# Patient Record
Sex: Male | Born: 1965 | Race: White | Hispanic: No | Marital: Married | Smoking: Former smoker
Health system: Southern US, Community
[De-identification: ages and names within clinical notes are randomized; demographics above are authoritative.]

## PROBLEM LIST (undated history)

## (undated) DIAGNOSIS — H9190 Unspecified hearing loss, unspecified ear: Secondary | ICD-10-CM

## (undated) DIAGNOSIS — I82409 Acute embolism and thrombosis of unspecified deep veins of unspecified lower extremity: Secondary | ICD-10-CM

## (undated) DIAGNOSIS — I219 Acute myocardial infarction, unspecified: Secondary | ICD-10-CM

## (undated) HISTORY — PX: BACK SURGERY: SHX140

---

## 2005-09-22 DIAGNOSIS — I82409 Acute embolism and thrombosis of unspecified deep veins of unspecified lower extremity: Secondary | ICD-10-CM

## 2005-09-22 HISTORY — DX: Acute embolism and thrombosis of unspecified deep veins of unspecified lower extremity: I82.409

## 2009-02-15 ENCOUNTER — Emergency Department (HOSPITAL_BASED_OUTPATIENT_CLINIC_OR_DEPARTMENT_OTHER): Admission: EM | Admit: 2009-02-15 | Discharge: 2009-02-15 | Payer: Self-pay | Admitting: Emergency Medicine

## 2009-02-22 ENCOUNTER — Emergency Department (HOSPITAL_BASED_OUTPATIENT_CLINIC_OR_DEPARTMENT_OTHER): Admission: EM | Admit: 2009-02-22 | Discharge: 2009-02-22 | Payer: Self-pay | Admitting: Emergency Medicine

## 2011-10-11 ENCOUNTER — Encounter (HOSPITAL_BASED_OUTPATIENT_CLINIC_OR_DEPARTMENT_OTHER): Payer: Self-pay | Admitting: *Deleted

## 2011-10-11 ENCOUNTER — Emergency Department (INDEPENDENT_AMBULATORY_CARE_PROVIDER_SITE_OTHER): Payer: BC Managed Care – PPO

## 2011-10-11 ENCOUNTER — Emergency Department (HOSPITAL_BASED_OUTPATIENT_CLINIC_OR_DEPARTMENT_OTHER)
Admission: EM | Admit: 2011-10-11 | Discharge: 2011-10-11 | Disposition: A | Discharge status: Home / Self Care | Payer: BC Managed Care – PPO | Attending: Emergency Medicine | Admitting: Emergency Medicine

## 2011-10-11 DIAGNOSIS — X58XXXA Exposure to other specified factors, initial encounter: Secondary | ICD-10-CM

## 2011-10-11 DIAGNOSIS — S6992XA Unspecified injury of left wrist, hand and finger(s), initial encounter: Secondary | ICD-10-CM

## 2011-10-11 DIAGNOSIS — W278XXA Contact with other nonpowered hand tool, initial encounter: Secondary | ICD-10-CM | POA: Insufficient documentation

## 2011-10-11 DIAGNOSIS — S6990XA Unspecified injury of unspecified wrist, hand and finger(s), initial encounter: Secondary | ICD-10-CM

## 2011-10-11 DIAGNOSIS — F17200 Nicotine dependence, unspecified, uncomplicated: Secondary | ICD-10-CM | POA: Insufficient documentation

## 2011-10-11 MED ORDER — HYDROCODONE-ACETAMINOPHEN 5-325 MG PO TABS: 2.0000 | ORAL_TABLET | Freq: Once | ORAL | Status: DC

## 2011-10-11 MED ORDER — SULFAMETHOXAZOLE-TRIMETHOPRIM 800-160 MG PO TABS: 1.0000 | ORAL_TABLET | Freq: Two times a day (BID) | ORAL | Status: AC

## 2011-10-11 MED ORDER — SULFAMETHOXAZOLE-TRIMETHOPRIM 800-160 MG PO TABS: 1.0000 | ORAL_TABLET | Freq: Two times a day (BID) | ORAL | Status: DC

## 2011-10-11 MED ORDER — HYDROCODONE-ACETAMINOPHEN 5-325 MG PO TABS: 2.0000 | ORAL_TABLET | ORAL | Status: AC | PRN

## 2011-10-11 NOTE — ED Provider Notes (Signed)
History     CSN: [id number]  Arrival date & time 10/11/11  1613   None     Chief Complaint  Patient presents with  . Hand Injury    (Consider location/radiation/quality/duration/timing/severity/associated sxs/prior treatment) Patient is a 46 y.o. male presenting with hand injury. The history is provided by the patient. No language interpreter was used.  Hand Injury  The incident occurred less than 1 hour ago. The incident occurred in the street. The injury mechanism was an incision. The pain is present in the left hand. The quality of the pain is described as aching. The pain is at a severity of 6/10. The pain is moderate. The pain has been constant since the incident. He reports no foreign bodies present. He has tried nothing for the symptoms. The treatment provided moderate relief.  Pt reports he jammed a screw driver into his hand.  Pt reports he can move everything.   History reviewed. No pertinent past medical history.  Past Surgical History  Procedure Date  . Back surgery     History reviewed. No pertinent family history.  History  Substance Use Topics  . Smoking status: Current Everyday Smoker  . Smokeless tobacco: Not on file  . Alcohol Use: No      Review of Systems  Skin: Positive for wound.  All other systems reviewed and are negative.    Allergies  Review of patient's allergies indicates no known allergies.  Home Medications  No current outpatient prescriptions on file.  BP 149/87  Pulse 76  Temp(Src) 97.8 F (36.6 C) (Oral)  Resp 20  Ht 5\' 8"  (1.727 m)  Wt 152 lb (68.947 kg)  BMI 23.11 kg/m2  SpO2 100%  Physical Exam  Nursing note and vitals reviewed. Constitutional: He appears well-developed and well-nourished.  HENT:  Head: Normocephalic.  Musculoskeletal: Normal range of motion. He exhibits tenderness.       punture wound thenar prominence,  Ns and nv intact  Neurological: He is alert.  Skin: Skin is warm.  Psychiatric: He has a  normal mood and affect.    ED Course  Procedures (including critical care time)  Labs Reviewed - No data to display Dg Hand Complete Left  10/11/2011  *RADIOLOGY REPORT*  Clinical Data: Blunt injury with screwdriver.  LEFT HAND - COMPLETE 3+ VIEW  Comparison: None.  Findings: There is no evidence of fracture or dislocation.  There is no evidence of arthropathy or other focal bony abnormality. Soft tissue swelling between the first and second metacarpals without radiopaque foreign body.  Subcutaneous air noted in the soft tissues.  IMPRESSION: Soft tissue injury.  No fracture or foreign body.  Original Report Authenticated By: Elsie Stain, M.D.     No diagnosis found.    MDM  Pt counseled on punture wounds.  I advised follow up with Orthopaedist on call for hand,  Pt given rx for hydrocodone and bactrim       Langston Masker, [place] 10/11/11 1756

## 2011-10-11 NOTE — ED Notes (Signed)
Pt states he "jabbed his hand with a screwdriver approx 1/2 hour ago" Puncture type wound to area at base of left thumb. Moves fingers. Feels touch. Cap refill < 3 sec
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
## 2011-10-12 NOTE — ED Provider Notes (Signed)
Medical screening examination/treatment/procedure(s) were performed by non-physician practitioner and as supervising physician I was immediately available for consultation/collaboration.  Hurman Horn, MD 10/12/11 [phone]

## 2011-11-01 ENCOUNTER — Encounter (HOSPITAL_COMMUNITY): Payer: Self-pay | Admitting: Emergency Medicine

## 2011-11-01 ENCOUNTER — Emergency Department (HOSPITAL_COMMUNITY)
Admission: EM | Admit: 2011-11-01 | Discharge: 2011-11-02 | Disposition: A | Discharge status: Home / Self Care | Payer: BC Managed Care – PPO | Attending: Emergency Medicine | Admitting: Emergency Medicine

## 2011-11-01 ENCOUNTER — Other Ambulatory Visit: Payer: Self-pay

## 2011-11-01 ENCOUNTER — Emergency Department (HOSPITAL_COMMUNITY): Payer: BC Managed Care – PPO

## 2011-11-01 DIAGNOSIS — R0789 Other chest pain: Secondary | ICD-10-CM | POA: Insufficient documentation

## 2011-11-01 DIAGNOSIS — I219 Acute myocardial infarction, unspecified: Secondary | ICD-10-CM

## 2011-11-01 DIAGNOSIS — F17200 Nicotine dependence, unspecified, uncomplicated: Secondary | ICD-10-CM | POA: Insufficient documentation

## 2011-11-01 HISTORY — DX: Acute myocardial infarction, unspecified: I21.9

## 2011-11-01 LAB — DIFFERENTIAL
Basophils Absolute: 0.1 10*3/uL (ref 0.0–0.1)
Basophils Relative: 1 % (ref 0–1)
Eosinophils Relative: 1 % (ref 0–5)
Lymphocytes Relative: 27 % (ref 12–46)
Monocytes Absolute: 0.9 10*3/uL (ref 0.1–1.0)
Monocytes Relative: 6 % (ref 3–12)

## 2011-11-01 LAB — COMPREHENSIVE METABOLIC PANEL
AST: 18 U/L (ref 0–37)
BUN: 13 mg/dL (ref 6–23)
CO2: 26 mEq/L (ref 19–32)
Calcium: 9.5 mg/dL (ref 8.4–10.5)
Creatinine, Ser: 0.84 mg/dL (ref 0.50–1.35)
GFR calc Af Amer: >90 mL/min (ref >90)
GFR calc non Af Amer: >90 mL/min (ref >90)

## 2011-11-01 LAB — CBC
HCT: 43.9 % (ref 39.0–52.0)
MCHC: 34.4 g/dL (ref 30.0–36.0)
MCV: 83.6 fL (ref 78.0–100.0)
RDW: 12.7 % (ref 11.5–15.5)

## 2011-11-01 LAB — CARDIAC PANEL(CRET KIN+CKTOT+MB+TROPI)
CK, MB: 2.4 ng/mL (ref 0.3–4.0)
Total CK: 100 U/L (ref 7–232)
Troponin I: <0.3 ng/mL (ref <0.30)

## 2011-11-01 MED ORDER — SODIUM CHLORIDE 0.9 % IV SOLN
Freq: Once | INTRAVENOUS | Status: AC
  Administered 2011-11-01: 23:00:00 via INTRAVENOUS
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
## 2011-11-01 NOTE — ED Provider Notes (Deleted)
9:18 PM  Date: 11/01/2011  Rate: 77  Rhythm: normal sinus rhythm  QRS Axis: normal  Intervals: normal  ST/T Wave abnormalities: normal  Conduction Disutrbances:none  Narrative Interpretation: Essentially normal EKg.  Old EKG Reviewed: none available    Carleene Cooper III, MD 11/01/11 2128

## 2011-11-01 NOTE — ED Notes (Signed)
Patient given urinal and made aware of need for urine sample 

## 2011-11-02 NOTE — ED Notes (Signed)
Patient is AOx4 and comfortable with his discharge instructions. 

## 2011-11-03 ENCOUNTER — Other Ambulatory Visit: Payer: Self-pay

## 2011-11-03 ENCOUNTER — Encounter (HOSPITAL_BASED_OUTPATIENT_CLINIC_OR_DEPARTMENT_OTHER): Payer: Self-pay

## 2011-11-03 ENCOUNTER — Emergency Department (INDEPENDENT_AMBULATORY_CARE_PROVIDER_SITE_OTHER): Payer: BC Managed Care – PPO

## 2011-11-03 ENCOUNTER — Emergency Department (HOSPITAL_BASED_OUTPATIENT_CLINIC_OR_DEPARTMENT_OTHER)
Admission: EM | Admit: 2011-11-03 | Discharge: 2011-11-03 | Disposition: A | Discharge status: Home / Self Care | Payer: BC Managed Care – PPO | Attending: Emergency Medicine | Admitting: Emergency Medicine

## 2011-11-03 DIAGNOSIS — S62339A Displaced fracture of neck of unspecified metacarpal bone, initial encounter for closed fracture: Secondary | ICD-10-CM

## 2011-11-03 DIAGNOSIS — X838XXA Intentional self-harm by other specified means, initial encounter: Secondary | ICD-10-CM

## 2011-11-03 DIAGNOSIS — R079 Chest pain, unspecified: Secondary | ICD-10-CM

## 2011-11-03 DIAGNOSIS — S62308A Unspecified fracture of other metacarpal bone, initial encounter for closed fracture: Secondary | ICD-10-CM

## 2011-11-03 DIAGNOSIS — S62319A Displaced fracture of base of unspecified metacarpal bone, initial encounter for closed fracture: Secondary | ICD-10-CM | POA: Insufficient documentation

## 2011-11-03 DIAGNOSIS — F17200 Nicotine dependence, unspecified, uncomplicated: Secondary | ICD-10-CM | POA: Insufficient documentation

## 2011-11-03 LAB — TROPONIN I: Troponin I: <0.3 ng/mL (ref <0.30)

## 2011-11-03 LAB — CBC
HCT: 43 % (ref 39.0–52.0)
MCH: 29.1 pg (ref 26.0–34.0)
MCHC: 35.1 g/dL (ref 30.0–36.0)
MCV: 82.9 fL (ref 78.0–100.0)
Platelets: 231 10*3/uL (ref 150–400)
RDW: 12.8 % (ref 11.5–15.5)
WBC: 11.9 10*3/uL — ABNORMAL HIGH (ref 4.0–10.5)

## 2011-11-03 LAB — BASIC METABOLIC PANEL
BUN: 17 mg/dL (ref 6–23)
Calcium: 9.6 mg/dL (ref 8.4–10.5)
Creatinine, Ser: 1.1 mg/dL (ref 0.50–1.35)
GFR calc Af Amer: >90 mL/min (ref >90)
GFR calc non Af Amer: 79 mL/min — ABNORMAL LOW (ref >90)

## 2011-11-03 MED ORDER — HYDROCODONE-ACETAMINOPHEN 5-500 MG PO TABS: 1.0000 | ORAL_TABLET | Freq: Four times a day (QID) | ORAL | Status: AC | PRN
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
## 2011-11-03 NOTE — ED Provider Notes (Signed)
Medical screening examination/treatment/procedure(s) were performed by non-physician practitioner and as supervising physician I was immediately available for consultation/collaboration.   Loreta Blouch M Dastan Krider, MD 11/03/11 2319 

## 2011-11-03 NOTE — ED Notes (Signed)
C/o CP x 2 days-was seen at [facility] ED for same-states CP continues-also c/o pain to left hand yesterday after punching a cabinet

## 2012-05-04 ENCOUNTER — Encounter (HOSPITAL_BASED_OUTPATIENT_CLINIC_OR_DEPARTMENT_OTHER): Payer: Self-pay

## 2012-05-04 ENCOUNTER — Emergency Department (HOSPITAL_BASED_OUTPATIENT_CLINIC_OR_DEPARTMENT_OTHER)
Admission: EM | Admit: 2012-05-04 | Discharge: 2012-05-04 | Disposition: A | Discharge status: Home / Self Care | Payer: BC Managed Care – PPO | Attending: Emergency Medicine | Admitting: Emergency Medicine

## 2012-05-04 ENCOUNTER — Emergency Department (HOSPITAL_BASED_OUTPATIENT_CLINIC_OR_DEPARTMENT_OTHER): Payer: BC Managed Care – PPO

## 2012-05-04 DIAGNOSIS — N433 Hydrocele, unspecified: Secondary | ICD-10-CM | POA: Insufficient documentation

## 2012-05-04 DIAGNOSIS — R1030 Lower abdominal pain, unspecified: Secondary | ICD-10-CM

## 2012-05-04 DIAGNOSIS — R109 Unspecified abdominal pain: Secondary | ICD-10-CM | POA: Insufficient documentation

## 2012-05-04 DIAGNOSIS — F17200 Nicotine dependence, unspecified, uncomplicated: Secondary | ICD-10-CM | POA: Insufficient documentation

## 2012-05-04 LAB — URINALYSIS, ROUTINE W REFLEX MICROSCOPIC
Bilirubin Urine: NEGATIVE
Hgb urine dipstick: NEGATIVE
Ketones, ur: NEGATIVE mg/dL
Nitrite: NEGATIVE
Urobilinogen, UA: 0.2 mg/dL (ref 0.0–1.0)

## 2012-05-04 LAB — URINE MICROSCOPIC-ADD ON

## 2012-05-04 MED ORDER — IBUPROFEN 800 MG PO TABS: 800.0000 mg | ORAL_TABLET | Freq: Three times a day (TID) | ORAL | Status: AC | PRN

## 2012-05-04 NOTE — ED Provider Notes (Signed)
History     CSN: [id number]  Arrival date & time 05/04/12  2112   First MD Initiated Contact with Patient 05/04/12 2153      Chief Complaint  Patient presents with  . Groin Pain    (Consider location/radiation/quality/duration/timing/severity/associated sxs/prior treatment) HPI Pt reports several days of R sided groin pain, associated with mild urinary burning, but no fever. Pain was initially in R testicle but has moved up over the last several hours. No swelling, lumps or penile discharge. No trauma.   History reviewed. No pertinent past medical history.  Past Surgical History  Procedure Date  . Back surgery     No family history on file.  History  Substance Use Topics  . Smoking status: Current Everyday Smoker  . Smokeless tobacco: Not on file  . Alcohol Use: Yes      Review of Systems All other systems reviewed and are negative except as noted in HPI.   Allergies  Review of patient's allergies indicates no known allergies.  Home Medications   Current Outpatient Rx  Name Route Sig Dispense Refill  . CLONAZEPAM 0.5 MG PO TABS Oral Take 0.5 mg by mouth 2 (two) times daily as needed.    Marland Kitchen PRAVASTATIN SODIUM 10 MG PO TABS Oral Take 10 mg by mouth daily.      BP 157/91  Pulse 79  Temp 97.6 F (36.4 C) (Oral)  Resp 18  Ht 5\' 8"  (1.727 m)  Wt 157 lb (71.215 kg)  BMI 23.87 kg/m2  SpO2 99%  Physical Exam  Nursing note and vitals reviewed. Constitutional: He is oriented to person, place, and time. He appears well-developed and well-nourished.  HENT:  Head: Normocephalic and atraumatic.  Eyes: EOM are normal. Pupils are equal, round, and reactive to light.  Neck: Normal range of motion. Neck supple.  Cardiovascular: Normal rate, normal heart sounds and intact distal pulses.   Pulmonary/Chest: Effort normal and breath sounds normal.  Abdominal: Bowel sounds are normal. He exhibits no distension. There is no tenderness. Hernia confirmed negative in the right  inguinal area and confirmed negative in the left inguinal area.  Genitourinary: Testes normal and penis normal.    Right testis shows no mass, no swelling and no tenderness. Left testis shows no mass, no swelling and no tenderness.  Musculoskeletal: Normal range of motion. He exhibits no edema and no tenderness.  Lymphadenopathy:       Right: No inguinal adenopathy present.       Left: No inguinal adenopathy present.  Neurological: He is alert and oriented to person, place, and time. He has normal strength. No cranial nerve deficit or sensory deficit.  Skin: Skin is warm and dry. No rash noted.  Psychiatric: He has a normal mood and affect.    ED Course  Procedures (including critical care time)  Labs Reviewed  URINALYSIS, ROUTINE W REFLEX MICROSCOPIC - Abnormal; Notable for the following:    Protein, ur 30 (*)     All other components within normal limits  URINE MICROSCOPIC-ADD ON   US Scrotum  05/04/2012  *RADIOLOGY REPORT*  Clinical Data: Right groin pain.  Testicular pain.  Dysuria.  ULTRASOUND OF SCROTUM  DOPPLER ULTRASOUND OF SCROTAL VESSELS  Technique:  Complete ultrasound examination of the testicles, epididymis, and other scrotal structures was performed.  Color and duplex Doppler ultrasound was utilized to evaluate blood flow to the testicles and scrotal contents.  Comparison: None.  Findings: The right testicle measures 47 mm x 23 mm x  24 mm.  There is normal testicular echotexture and normal color flow when compared to the left testicle.  Low left testicle measures 46 mm x 22 mm x 29 mm.  Normal echotexture.  Normal color flow.  Both testicles demonstrate normal arterial and venous waveforms.  There is a small right hydrocele.  Epididymal cyst is present in the right epididymis measuring 5 mm x 5 mm x 6 mm.  The left epididymis demonstrates a tiny epididymal cyst measuring between 1 mm and 2 mm.  Left epididymal echotexture is within normal limits.  Small left hydrocele.  No  varicoceles.  When evaluating the epididymal color flow, this appears normal and symmetric when compared to the adjacent testicle bilaterally without swelling or evidence of epididymitis.  IMPRESSION: No acute abnormality. Negative for torsion.  Small bilateral hydroceles.  Small bilateral right greater than left epididymal cysts.  Original Report Authenticated By: Andreas Newport, M.D.     No diagnosis found.    MDM  [place] above neg for acute surgical problem. ?Epididymal cyst as the cause of his pain. Advised Urology followup for any further pain.         Chaya Dehaan B. Bernette Mayers, MD 05/04/12 [phone]

## 2012-05-04 NOTE — ED Notes (Signed)
Patient transported to Ultrasound 

## 2012-05-04 NOTE — ED Notes (Signed)
Right groin pain, dysuria x 2 days

## 2013-05-31 DIAGNOSIS — H9190 Unspecified hearing loss, unspecified ear: Secondary | ICD-10-CM

## 2013-05-31 HISTORY — DX: Unspecified hearing loss, unspecified ear: H91.90

## 2013-06-09 ENCOUNTER — Emergency Department (HOSPITAL_BASED_OUTPATIENT_CLINIC_OR_DEPARTMENT_OTHER)
Admission: EM | Admit: 2013-06-09 | Discharge: 2013-06-09 | Discharge status: Against Medical Advice | Payer: BC Managed Care – PPO | Attending: Emergency Medicine | Admitting: Emergency Medicine

## 2013-06-09 ENCOUNTER — Encounter (HOSPITAL_BASED_OUTPATIENT_CLINIC_OR_DEPARTMENT_OTHER): Payer: Self-pay | Admitting: Emergency Medicine

## 2013-06-09 DIAGNOSIS — I252 Old myocardial infarction: Secondary | ICD-10-CM | POA: Insufficient documentation

## 2013-06-09 DIAGNOSIS — F17200 Nicotine dependence, unspecified, uncomplicated: Secondary | ICD-10-CM | POA: Insufficient documentation

## 2013-06-09 DIAGNOSIS — R109 Unspecified abdominal pain: Secondary | ICD-10-CM | POA: Insufficient documentation

## 2013-06-09 HISTORY — DX: Acute myocardial infarction, unspecified: I21.9

## 2013-06-09 HISTORY — DX: Unspecified hearing loss, unspecified ear: H91.90

## 2013-06-09 LAB — URINALYSIS, ROUTINE W REFLEX MICROSCOPIC
Bilirubin Urine: NEGATIVE
Glucose, UA: NEGATIVE mg/dL
Hgb urine dipstick: NEGATIVE
Specific Gravity, Urine: 1.022 (ref 1.005–1.030)
pH: 6 (ref 5.0–8.0)

## 2013-06-09 NOTE — ED Notes (Addendum)
LUQ pain intermittently x3-4 weeks of varying intensity. Decrease in appetite.  Denies N/V/D. Pain is sharp and lasts 20 - 90 min when it hits. Sometimes it "drops me to my knees".

## 2013-06-10 ENCOUNTER — Emergency Department (HOSPITAL_BASED_OUTPATIENT_CLINIC_OR_DEPARTMENT_OTHER): Payer: BC Managed Care – PPO

## 2013-06-10 ENCOUNTER — Encounter (HOSPITAL_BASED_OUTPATIENT_CLINIC_OR_DEPARTMENT_OTHER): Payer: Self-pay | Admitting: *Deleted

## 2013-06-10 ENCOUNTER — Emergency Department (HOSPITAL_BASED_OUTPATIENT_CLINIC_OR_DEPARTMENT_OTHER)
Admission: EM | Admit: 2013-06-10 | Discharge: 2013-06-10 | Disposition: A | Discharge status: Home / Self Care | Payer: BC Managed Care – PPO | Attending: Emergency Medicine | Admitting: Emergency Medicine

## 2013-06-10 DIAGNOSIS — I252 Old myocardial infarction: Secondary | ICD-10-CM | POA: Insufficient documentation

## 2013-06-10 DIAGNOSIS — F17200 Nicotine dependence, unspecified, uncomplicated: Secondary | ICD-10-CM | POA: Insufficient documentation

## 2013-06-10 DIAGNOSIS — R109 Unspecified abdominal pain: Secondary | ICD-10-CM

## 2013-06-10 DIAGNOSIS — Z79899 Other long term (current) drug therapy: Secondary | ICD-10-CM | POA: Insufficient documentation

## 2013-06-10 LAB — LIPASE, BLOOD: Lipase: 26 U/L (ref 11–59)

## 2013-06-10 LAB — URINALYSIS, ROUTINE W REFLEX MICROSCOPIC
Bilirubin Urine: NEGATIVE
Ketones, ur: 40 mg/dL — AB
Nitrite: NEGATIVE
Protein, ur: NEGATIVE mg/dL
Specific Gravity, Urine: 1.027 (ref 1.005–1.030)
Urobilinogen, UA: 1 mg/dL (ref 0.0–1.0)

## 2013-06-10 LAB — CBC WITH DIFFERENTIAL/PLATELET
Eosinophils Absolute: 0.1 10*3/uL (ref 0.0–0.7)
Eosinophils Relative: 1 % (ref 0–5)
Hemoglobin: 16.3 g/dL (ref 13.0–17.0)
Lymphs Abs: 3.7 10*3/uL (ref 0.7–4.0)
MCH: 29.4 pg (ref 26.0–34.0)
MCV: 84.3 fL (ref 78.0–100.0)
Monocytes Relative: 7 % (ref 3–12)
Neutrophils Relative %: 60 % (ref 43–77)
RBC: 5.55 MIL/uL (ref 4.22–5.81)

## 2013-06-10 LAB — COMPREHENSIVE METABOLIC PANEL
Alkaline Phosphatase: 64 U/L (ref 39–117)
BUN: 14 mg/dL (ref 6–23)
GFR calc Af Amer: >90 mL/min (ref >90)
Glucose, Bld: 88 mg/dL (ref 70–99)
Potassium: 4 mEq/L (ref 3.5–5.1)
Total Bilirubin: 0.8 mg/dL (ref 0.3–1.2)
Total Protein: 7.2 g/dL (ref 6.0–8.3)

## 2013-06-10 MED ORDER — IOHEXOL 300 MG/ML  SOLN
100.0000 mL | Freq: Once | INTRAMUSCULAR | Status: AC | PRN
  Administered 2013-06-10: 100 mL via INTRAVENOUS

## 2013-06-10 MED ORDER — TRAMADOL HCL 50 MG PO TABS: 50.0000 mg | ORAL_TABLET | Freq: Four times a day (QID) | ORAL | Status: DC | PRN

## 2013-06-10 MED ORDER — DICYCLOMINE HCL 20 MG PO TABS: 20.0000 mg | ORAL_TABLET | Freq: Four times a day (QID) | ORAL | Status: DC | PRN

## 2013-06-10 MED ORDER — SODIUM CHLORIDE 0.9 % IV BOLUS (SEPSIS)
1000.0000 mL | Freq: Once | INTRAVENOUS | Status: AC
  Administered 2013-06-10: 1000 mL via INTRAVENOUS

## 2013-06-10 MED ORDER — IOHEXOL 300 MG/ML  SOLN
50.0000 mL | Freq: Once | INTRAMUSCULAR | Status: AC | PRN
  Administered 2013-06-10: 50 mL via ORAL

## 2013-06-10 NOTE — ED Provider Notes (Signed)
CSN: [id number]     Arrival date & time 06/10/13  1319 History   First MD Initiated Contact with Patient 06/10/13 1453     Chief Complaint  Patient presents with  . Abdominal Pain   (Consider location/radiation/quality/duration/timing/severity/associated sxs/prior Treatment) HPI Comments: Patient presents to the ER for evaluation of left-sided abdominal pain. Patient reports that the symptoms have been present for 2 or 3 weeks. Initially he was experiencing intermittent pain, the last 2 days, however, pain has been more continuous. He reports that he continues to have pain in the left mid and lower abdomen with intermittent episodes of sharp stabbing pains. No associated fever, nausea, vomiting, diarrhea, urinary symptoms. Patient has not identified any alleviating or exacerbating factors.  Patient is a 47 y.o. male presenting with abdominal pain.  Abdominal Pain   Past Medical History  Diagnosis Date  . MI (myocardial infarction)   . Hearing deficit    Past Surgical History  Procedure Laterality Date  . Back surgery     History reviewed. No pertinent family history. History  Substance Use Topics  . Smoking status: Current Every Day Smoker -- 1.00 packs/day  . Smokeless tobacco: Not on file  . Alcohol Use: Yes    Review of Systems  Gastrointestinal: Positive for abdominal pain.  All other systems reviewed and are negative.    Allergies  Review of patient's allergies indicates no known allergies.  Home Medications   Current Outpatient Rx  Name  Route  Sig  Dispense  Refill  . clonazePAM (KLONOPIN) 0.5 MG tablet   Oral   Take 0.5 mg by mouth 2 (two) times daily as needed.         . pravastatin (PRAVACHOL) 10 MG tablet   Oral   Take 10 mg by mouth daily.          BP 128/79  Pulse 72  Temp(Src) 97.6 F (36.4 C) (Oral)  Resp 16  Ht 5\' 8"  (1.727 m)  Wt 160 lb (72.576 kg)  BMI 24.33 kg/m2  SpO2 100% Physical Exam  Constitutional: He is oriented to person,  place, and time. He appears well-developed and well-nourished. No distress.  HENT:  Head: Normocephalic and atraumatic.  Right Ear: Hearing normal.  Left Ear: Hearing normal.  Nose: Nose normal.  Mouth/Throat: Oropharynx is clear and moist and mucous membranes are normal.  Eyes: Conjunctivae and EOM are normal. Pupils are equal, round, and reactive to light.  Neck: Normal range of motion. Neck supple.  Cardiovascular: Regular rhythm, S1 normal and S2 normal.  Exam reveals no gallop and no friction rub.   No murmur heard. Pulmonary/Chest: Effort normal and breath sounds normal. No respiratory distress. He exhibits no tenderness.  Abdominal: Soft. Normal appearance and bowel sounds are normal. There is no hepatosplenomegaly. There is tenderness. There is no rebound, no guarding, no tenderness at McBurney's point and negative Murphy's sign. No hernia.    Musculoskeletal: Normal range of motion.  Neurological: He is alert and oriented to person, place, and time. He has normal strength. No cranial nerve deficit or sensory deficit. Coordination normal. GCS eye subscore is 4. GCS verbal subscore is 5. GCS motor subscore is 6.  Skin: Skin is warm, dry and intact. No rash noted. No cyanosis.  Psychiatric: He has a normal mood and affect. His speech is normal and behavior is normal. Thought content normal.    ED Course  Procedures (including critical care time) Labs Review Labs Reviewed  CBC WITH DIFFERENTIAL - Abnormal;  Notable for the following:    WBC 11.4 (*)    All other components within normal limits  COMPREHENSIVE METABOLIC PANEL - Abnormal; Notable for the following:    GFR calc non Af Amer 89 (*)    All other components within normal limits  URINALYSIS, ROUTINE W REFLEX MICROSCOPIC - Abnormal; Notable for the following:    Ketones, ur 40 (*)    All other components within normal limits  LIPASE, BLOOD   Imaging Review Ct Abdomen Pelvis W Contrast  06/10/2013   CLINICAL DATA:   Left abdominal pain, leukocytosis  EXAM: CT ABDOMEN AND PELVIS WITH CONTRAST  TECHNIQUE: Multidetector CT imaging of the abdomen and pelvis was performed using the standard protocol following bolus administration of intravenous contrast.  CONTRAST:  OMNIPAQUE IOHEXOL 300 MG/ML  SOLN  COMPARISON:  None.  FINDINGS: Lung bases are clear.  Scattered hepatic cysts and/or hemangiomas measuring up to 9 mm in the left hepatic lobe (series 2/image 10).  Spleen, pancreas, and adrenal glands are within normal limits.  Gallbladder is unremarkable. No intrahepatic or extrahepatic ductal dilatation.  Kidneys are within normal limits. No hydronephrosis.  No evidence of bowel obstruction. Normal appendix.  No evidence of abdominal aortic aneurysm.  No abdominopelvic ascites.  No suspicious abdominopelvic lymphadenopathy.  Prostate is unremarkable.  Bladder is within normal limits.  Degenerative changes at L5-S1.  IMPRESSION: No evidence of bowel obstruction. Normal appendix.  No CT findings to account for the patient's left abdominal pain.   Electronically Signed   By: Charline Bills M.D.   On: 06/10/2013 16:57    MDM  Diagnosis: Abdominal pain  Patient presents to the ER for evaluation of abdominal pain. Patient reports that the pain has been intermittent for the last several weeks. He has no other associated symptoms. Examination revealed mild tenderness on the left side of the abdomen without peritonitis. Workup is unrevealing. Blood work as well as CAT scan of abdomen and pelvis without contrast did not show any acute abnormalities. She will be treated with antispasmodic and analgesia, refer to GI.    Gilda Crease, MD 06/10/13 [phone]

## 2013-06-10 NOTE — ED Notes (Signed)
Patient transported to CT 

## 2013-06-10 NOTE — ED Notes (Signed)
Pt reports that he left AMA last night because he waited too long.  States that he has had intermittent LLQ pain x 2-3 weeks.  Denies urinary symptoms. Decreased appetite.  Denies N/V/D.

## 2016-04-11 ENCOUNTER — Emergency Department (HOSPITAL_BASED_OUTPATIENT_CLINIC_OR_DEPARTMENT_OTHER): Payer: Self-pay

## 2016-04-11 ENCOUNTER — Emergency Department (HOSPITAL_BASED_OUTPATIENT_CLINIC_OR_DEPARTMENT_OTHER)
Admission: EM | Admit: 2016-04-11 | Discharge: 2016-04-12 | Disposition: A | Discharge status: Home / Self Care | Payer: Self-pay | Attending: Emergency Medicine | Admitting: Emergency Medicine

## 2016-04-11 ENCOUNTER — Encounter (HOSPITAL_BASED_OUTPATIENT_CLINIC_OR_DEPARTMENT_OTHER): Payer: Self-pay

## 2016-04-11 DIAGNOSIS — I252 Old myocardial infarction: Secondary | ICD-10-CM | POA: Insufficient documentation

## 2016-04-11 DIAGNOSIS — F17200 Nicotine dependence, unspecified, uncomplicated: Secondary | ICD-10-CM | POA: Insufficient documentation

## 2016-04-11 DIAGNOSIS — M79661 Pain in right lower leg: Secondary | ICD-10-CM | POA: Insufficient documentation

## 2016-04-11 HISTORY — DX: Acute embolism and thrombosis of unspecified deep veins of unspecified lower extremity: I82.409

## 2016-04-11 NOTE — ED Provider Notes (Signed)
CSN: [id number]     Arrival date & time 04/11/16  2123 History   First MD Initiated Contact with Patient 04/11/16 2356     Chief Complaint  Patient presents with  . Leg Pain     (Consider location/radiation/quality/duration/timing/severity/associated sxs/prior Treatment) HPI  This is a 50 year old male with a remote history of DVT in his right lower leg. He is here with a one-day history of pain in his right medial calf. The pain is moderate and worse with palpation. Pain is similar to previous DVT. There is no associated redness or swelling. He denies injury.   Past Medical History  Diagnosis Date  . MI (myocardial infarction) (HCC)   . Hearing deficit   . DVT (deep venous thrombosis) [facility](HCC)    Past Surgical History  Procedure Laterality Date  . Back surgery     No family history on file. Social History  Substance Use Topics  . Smoking status: Current Every Day Smoker -- 1.00 packs/day  . Smokeless tobacco: None  . Alcohol Use: No    Review of Systems  All other systems reviewed and are negative.   Allergies  Review of patient's allergies indicates no known allergies.  Home Medications   Prior to Admission medications   Not on File   BP 148/100 mmHg  Pulse 58  Temp(Src) 98.6 F (37 C) (Oral)  Resp 16  Ht 5\' 8"  (1.727 m)  Wt 150 lb (68.04 kg)  BMI 22.81 kg/m2  SpO2 100%   Physical Exam  General: Well-developed, well-nourished male in no acute distress; appearance consistent with age of record HENT: normocephalic; atraumatic Eyes: pupils equal, round and reactive to light; extraocular muscles intact Neck: supple Heart: regular rate and rhythm; Lungs: clear to auscultation bilaterally Abdomen: soft; nondistended Extremities: No deformity; full range of motion; pulses normal; tenderness of right medial calf without edema, erythema or warmth Neurologic: Awake, alert and oriented; motor function intact in all extremities and symmetric; no facial droop Skin:  Warm and dry Psychiatric: Normal mood and affect    ED Course  Procedures (including critical care time)   MDM  Nursing notes and vitals signs, including pulse oximetry, reviewed.  Summary of this visit's results, reviewed by myself:  Imaging Studies: [place] Venous Img Lower Unilateral Right  04/11/2016  CLINICAL DATA:  Right lower leg pain for 2 days. History of right calf DVT 10 years ago. EXAM: Choose 2 LOWER EXTREMITY VENOUS DOPPLER ULTRASOUND TECHNIQUE: Gray-scale sonography with graded compression, as well as color Doppler and duplex ultrasound were performed to evaluate the lower extremity deep venous systems from the level of the common femoral vein and including the common femoral, femoral, profunda femoral, popliteal and calf veins including the posterior tibial, peroneal and gastrocnemius veins when visible. The superficial great saphenous vein was also interrogated. Spectral Doppler was utilized to evaluate flow at rest and with distal augmentation maneuvers in the common femoral, femoral and popliteal veins. COMPARISON:  None. FINDINGS: Contralateral Common Femoral Vein: Respiratory phasicity is normal and symmetric with the symptomatic side. No evidence of thrombus. Normal compressibility. Common Femoral Vein: No evidence of thrombus. Normal compressibility, respiratory phasicity and response to augmentation. Saphenofemoral Junction: No evidence of thrombus. Normal compressibility and flow on color Doppler imaging. Profunda Femoral Vein: No evidence of thrombus. Normal compressibility and flow on color Doppler imaging. Femoral Vein: No evidence of thrombus. Normal compressibility, respiratory phasicity and response to augmentation. Popliteal Vein: No evidence of thrombus. Normal compressibility, respiratory phasicity and response to augmentation.  Calf Veins: No evidence of thrombus. Normal compressibility and flow on color Doppler imaging. Superficial Great Saphenous Vein: No evidence of  thrombus. Normal compressibility and flow on color Doppler imaging. Venous Reflux:  None. Other Findings:  None. IMPRESSION: No evidence of deep venous thrombosis. Electronically Signed   By: Sebastian Ache M.D.   On: 04/11/2016 23:11   Patient advised of reassuring Doppler findings.     Paula Libra, MD 04/12/16 0005

## 2016-04-11 NOTE — ED Notes (Signed)
Right LE pain x 2 days-denies injury-hx of blood clot to leg-NAD-steady gait

## 2016-04-12 MED ORDER — NAPROXEN 250 MG PO TABS
500.0000 mg | ORAL_TABLET | Freq: Once | ORAL | Status: AC
  Administered 2016-04-12: 500 mg via ORAL
  Filled 2016-04-12: qty 2

## 2018-02-09 ENCOUNTER — Emergency Department (HOSPITAL_BASED_OUTPATIENT_CLINIC_OR_DEPARTMENT_OTHER): Payer: Self-pay

## 2018-02-09 ENCOUNTER — Emergency Department (HOSPITAL_BASED_OUTPATIENT_CLINIC_OR_DEPARTMENT_OTHER)
Admission: EM | Admit: 2018-02-09 | Discharge: 2018-02-09 | Disposition: A | Discharge status: Home / Self Care | Payer: Self-pay | Attending: Emergency Medicine | Admitting: Emergency Medicine

## 2018-02-09 ENCOUNTER — Other Ambulatory Visit: Payer: Self-pay

## 2018-02-09 ENCOUNTER — Encounter (HOSPITAL_BASED_OUTPATIENT_CLINIC_OR_DEPARTMENT_OTHER): Payer: Self-pay | Admitting: *Deleted

## 2018-02-09 DIAGNOSIS — I252 Old myocardial infarction: Secondary | ICD-10-CM | POA: Insufficient documentation

## 2018-02-09 DIAGNOSIS — W01198A Fall on same level from slipping, tripping and stumbling with subsequent striking against other object, initial encounter: Secondary | ICD-10-CM | POA: Insufficient documentation

## 2018-02-09 DIAGNOSIS — S2231XA Fracture of one rib, right side, initial encounter for closed fracture: Secondary | ICD-10-CM | POA: Insufficient documentation

## 2018-02-09 DIAGNOSIS — Y9389 Activity, other specified: Secondary | ICD-10-CM | POA: Insufficient documentation

## 2018-02-09 DIAGNOSIS — Y92012 Bathroom of single-family (private) house as the place of occurrence of the external cause: Secondary | ICD-10-CM | POA: Insufficient documentation

## 2018-02-09 DIAGNOSIS — Y998 Other external cause status: Secondary | ICD-10-CM | POA: Insufficient documentation

## 2018-02-09 MED ORDER — DICLOFENAC SODIUM 1 % TD GEL: 4.0000 g | Freq: Four times a day (QID) | TRANSDERMAL | 0 refills | Status: DC

## 2018-02-09 MED ORDER — LIDOCAINE 5 % EX PTCH: 1.0000 | MEDICATED_PATCH | CUTANEOUS | 0 refills | Status: DC

## 2018-02-09 MED ORDER — KETOROLAC TROMETHAMINE 60 MG/2ML IM SOLN
30.0000 mg | Freq: Once | INTRAMUSCULAR | Status: AC
Start: 2018-02-09 — End: 2018-02-09
  Administered 2018-02-09: 30 mg via INTRAMUSCULAR
  Filled 2018-02-09: qty 2

## 2018-02-09 MED ORDER — TRAMADOL HCL 50 MG PO TABS: 50.0000 mg | ORAL_TABLET | Freq: Four times a day (QID) | ORAL | 0 refills | Status: DC | PRN

## 2018-02-09 MED ORDER — TRAMADOL HCL 50 MG PO TABS
50.0000 mg | ORAL_TABLET | Freq: Once | ORAL | Status: AC
  Administered 2018-02-09: 50 mg via ORAL
  Filled 2018-02-09: qty 1

## 2018-02-09 NOTE — ED Notes (Signed)
ED Provider at bedside. 

## 2018-02-09 NOTE — ED Triage Notes (Signed)
He fell off a 2' ladder hitting his right ribs on a tub. Pain in his ribs.

## 2018-02-09 NOTE — Discharge Instructions (Addendum)
You have sustained a rib injury.  There is evidence of a single nondisplaced fracture of the right seventh rib.  Please adhere to the following instructions: Incentive spirometer: This device is used to ensure proper expansion of the lungs and to help prevent secondary issues, such as pneumonia.  Think of this as physical therapy for your lungs while you are injured.  Perform lung expansion exercises every 1-2 hours while awake.  Have an initial goal of 1000 mL and then work to increase this value.  Antiinflammatory medications: Take 600 mg of ibuprofen every 6 hours or 440 mg (over the counter dose) to 500 mg (prescription dose) of naproxen every 12 hours for the next 3 days. After this time, these medications may be used as needed for pain. Take these medications with food to avoid upset stomach. Choose only one of these medications, do not take them together. Tylenol: Should you continue to have additional pain while taking the ibuprofen or naproxen, you may add in tylenol as needed. Your daily total maximum amount of tylenol from all sources should be limited to /day for persons without liver problems, or /day for those with liver problems. Tramadol: May use the tramadol, as needed, for severe pain.  Do not drive or perform other dangerous activities while taking the tramadol. Diclofenac gel: This is a topical anti-inflammatory medication.  You may try applying it for localized pain relief.  Other pain management: Many parts of pain management involve experimentation to find what works for you as an individual patient.  You may try lidocaine patches, topical pain relievers, or hot/cold packs.  You may also need to change your sleeping position.  This may involve sleeping propped up in a chair or with extra pillows.  Duration of pain: For bruising or contusions to the ribs, pain can last 4-6 weeks.  For rib fractures, you can expect to have discomfort for 6-12 weeks.  It should be noted  that even if there are no obvious fractures on the x-rays, you may have what is called an occult fracture.  This simply means that the bone is broken, but is not broken enough to be noted on x-ray.  Follow-up: Please follow-up with a primary care provider for any further management of this issue.  Any further pain management should also be handled by a primary care provider.  Return: Return to the ED should you begin to have significantly worsening pain, onset of shortness of breath (not just hesitancy to take a deep breath due to pain), fever over 100.3 F accompanied by cough, coughing up blood, or any other major concerns.

## 2018-02-09 NOTE — ED Provider Notes (Signed)
[facility] EMERGENCY DEPARTMENT Provider Note   CSN: [id number] Arrival date & time: 02/09/18  1743     History   Chief Complaint Chief Complaint  Patient presents with  . Fall    HPI Colton White is a 52 y.o. male.  HPI   Colton White is a 52 y.o. male, with a history of DVT and MI, presenting to the ED with right-sided rib pain following a fall that occurred around 1 PM today.  States he slipped while standing on a 2 foot ladder and landed with his right ribs against the edge of a bathtub.  Pain is severe, sharp, nonradiating.  He has not taken any medications prior to arrival.   Denies shortness of breath, neck/back pain, head injury, LOC, abdominal pain, nausea/vomiting, changes in bowel or bladder function, numbness, weakness, or any other complaints.   Past Medical History:  Diagnosis Date  . DVT (deep venous thrombosis) (HCC)   . Hearing deficit   . MI (myocardial infarction) (HCC)     There are no active problems to display for this patient.   Past Surgical History:  Procedure Laterality Date  . BACK SURGERY          Home Medications    Prior to Admission medications   Medication Sig Start Date End Date Taking? Authorizing Provider  diclofenac sodium (VOLTAREN) 1 % GEL Apply 4 g topically 4 (four) times daily. 02/09/18   Demya Scruggs C, PA-C  lidocaine (LIDODERM) 5 % Place 1 patch onto the skin daily. Remove & Discard patch within 12 hours or as directed by MD 02/09/18   Laythan Hayter C, PA-C  traMADol (ULTRAM) 50 MG tablet Take 1 tablet (50 mg total) by mouth every 6 (six) hours as needed. 02/09/18   Maycen Degregory, Hillard Danker, PA-C    Family History No family history on file.  Social History Social History   Tobacco Use  . Smoking status: Current Every Day Smoker    Packs/day: 1.00  . Smokeless tobacco: Never Used  Substance Use Topics  . Alcohol use: No  . Drug use: No     Allergies   Patient has no known allergies.   Review of Systems Review of  Systems  Respiratory: Negative for shortness of breath.   Cardiovascular: Negative for chest pain.  Gastrointestinal: Negative for abdominal pain, nausea and vomiting.  Genitourinary: Negative for hematuria.  Musculoskeletal: Negative for back pain and neck pain.       Right rib pain  Neurological: Negative for weakness and numbness.  All other systems reviewed and are negative.    Physical Exam Updated Vital Signs BP (!) 153/96 (BP Location: Right Arm)   Pulse 91   Temp 98.3 F (36.8 C) (Oral)   Resp 18   Ht  (1.727 m)   Wt 72.6 kg (160 lb)   SpO2 96%   BMI 24.33 kg/m   Physical Exam  Constitutional: He appears well-developed and well-nourished. No distress.  HENT:  Head: Normocephalic and atraumatic.  Eyes: Conjunctivae are normal.  Neck: Neck supple.  Cardiovascular: Normal rate, regular rhythm, normal heart sounds and intact distal pulses.  Pulmonary/Chest: Effort normal and breath sounds normal. No respiratory distress.     He exhibits tenderness.  No increased work of breathing.  Speaks in full sentences without difficulty.  Tenderness in the area indicated.  No noted bruising, instability, crepitus, or deformity.  Abdominal: Soft. There is no tenderness. There is no guarding.  Musculoskeletal: He exhibits  no edema.  Normal motor function intact in all extremities and spine. No midline spinal tenderness.   Lymphadenopathy:    He has no cervical adenopathy.  Neurological: He is alert.  No sensory deficits. Strength 5/5 in all extremities. No gait disturbance. Cranial nerves III-XII grossly intact.   Skin: Skin is warm and dry. He is not diaphoretic.  Psychiatric: He has a normal mood and affect. His behavior is normal.  Nursing note and vitals reviewed.    ED Treatments / Results  Labs (all labs ordered are listed, but only abnormal results are displayed) Labs Reviewed - No data to display  EKG None  Radiology Dg Ribs Unilateral W/chest  Right  Result Date: 02/09/2018 CLINICAL DATA:  Patient fell off ladder landing on chest. EXAM: RIGHT RIBS AND CHEST - 3+ VIEW COMPARISON:  CXR 11/03/2011 FINDINGS: Acute nondisplaced posterolateral right seventh rib fracture. There is no evidence of pneumothorax or pleural effusion. Both lungs are clear. Heart size and mediastinal contours are within normal limits. IMPRESSION: Acute nondisplaced posterolateral right seventh rib fracture. Electronically Signed   By: Tollie Eth M.D.   On: 02/09/2018 18:43    Procedures Procedures (including critical care time)  Medications Ordered in ED Medications  traMADol (ULTRAM) tablet 50 mg (has no administration in time range)  ketorolac (TORADOL) injection 30 mg (has no administration in time range)     Initial Impression / Assessment and Plan / ED Course  I have reviewed the triage vital signs and the nursing notes.  Pertinent labs & imaging results that were available during my care of the patient were reviewed by me and considered in my medical decision making (see chart for details).     Patient presents for evaluation following a fall.  Nondisplaced single rib fracture noted on x-ray.  No evidence of pneumothorax. Pain management was discussed.  States he prefers no strong narcotics, such as Vicodin or Percocet.  Tramadol was brought up as an option and patient stated he would try this. Discussed incentive spirometry.  Patient given a tutorial and sent home with spirometer. The patient was given instructions for home care as well as return precautions. Patient voices understanding of these instructions, accepts the plan, and is comfortable with discharge.   Final Clinical Impressions(s) / ED Diagnoses   Final diagnoses:  Closed fracture of one rib of right side, initial encounter    ED Discharge Orders        Ordered    traMADol (ULTRAM) 50 MG tablet  Every 6 hours PRN     02/09/18 1929    diclofenac sodium (VOLTAREN) 1 % GEL  4  times daily     02/09/18 1929    lidocaine (LIDODERM) 5 %  Every 24 hours     02/09/18 1929       Concepcion Living 02/09/18 1936    Terrilee Files, MD 02/10/18 1009

## 2019-02-12 IMAGING — CR DG RIBS W/ CHEST 3+V*R*
5 series · 5 of 5 positions shown · non-contrast
Comparison: CXR 11/03/2011

CLINICAL DATA: Patient fell off ladder landing on chest.

EXAM:
RIGHT RIBS AND CHEST - 3+ VIEW

[w chest pa]
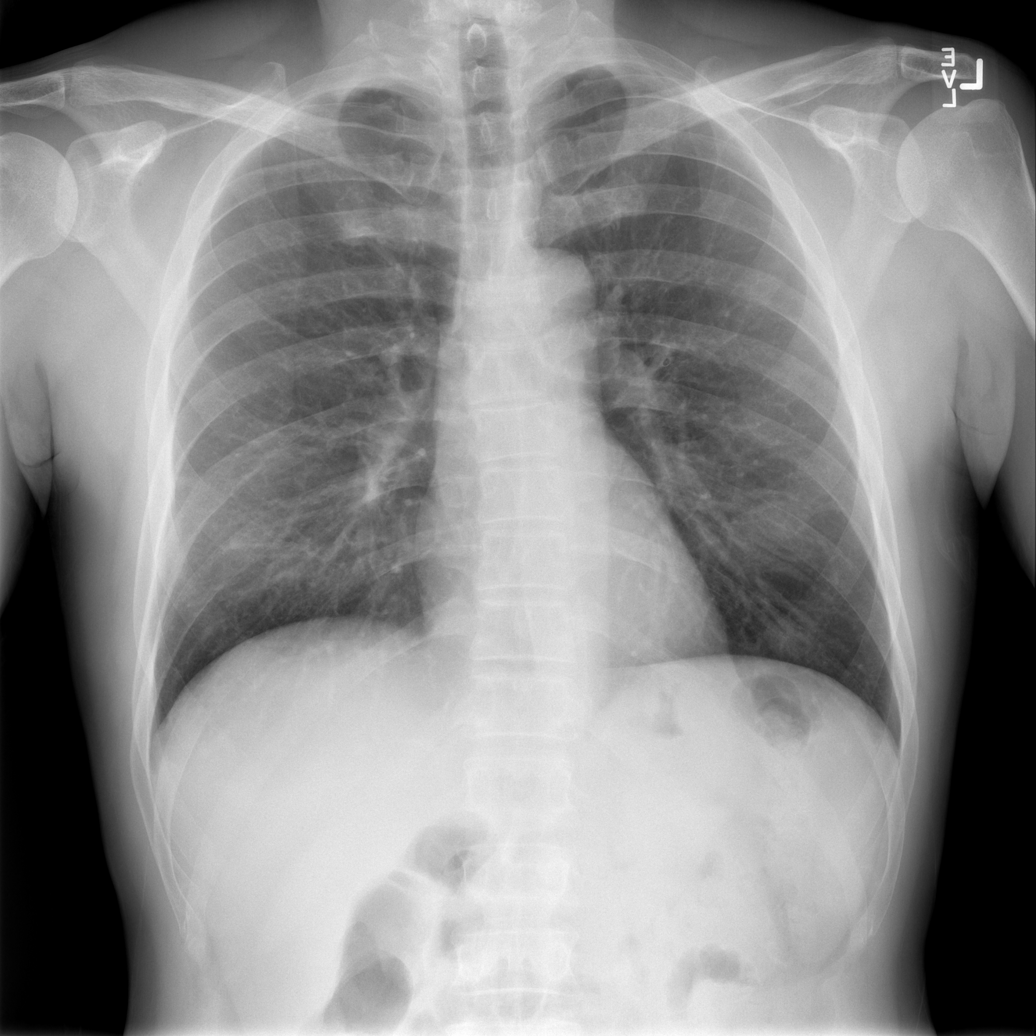

[w ribs ap/pa upper right (1 of 2)]
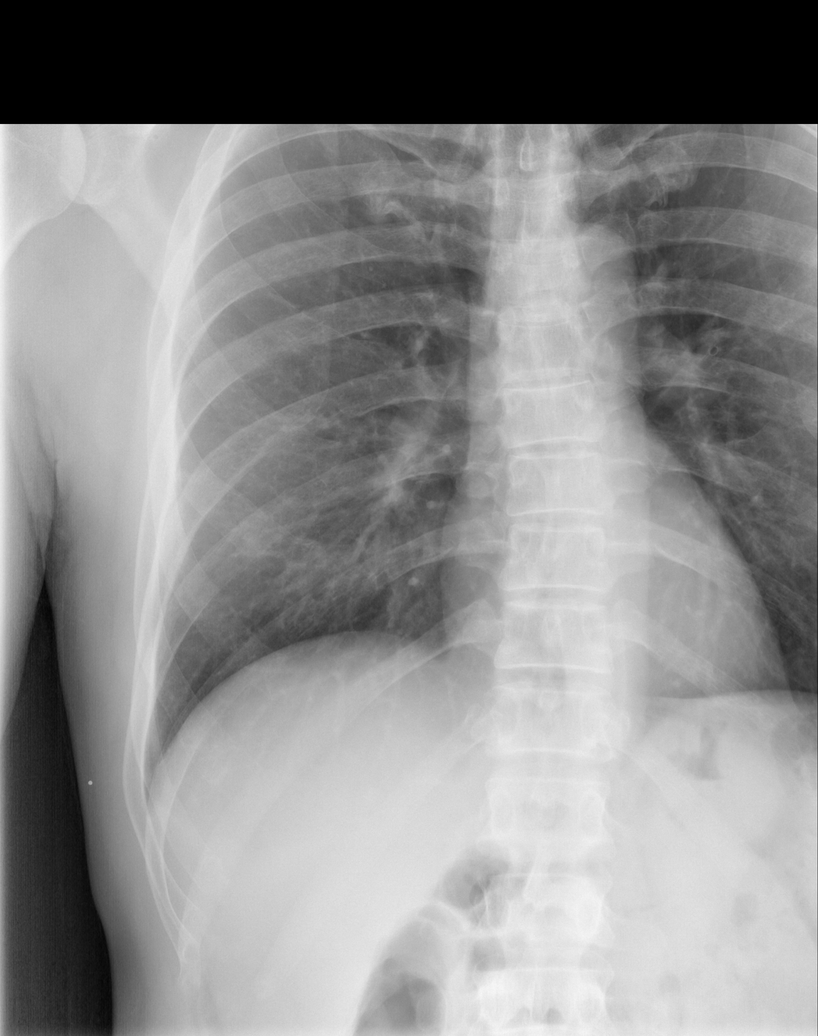

[w ribs ap/pa upper right (2 of 2)]
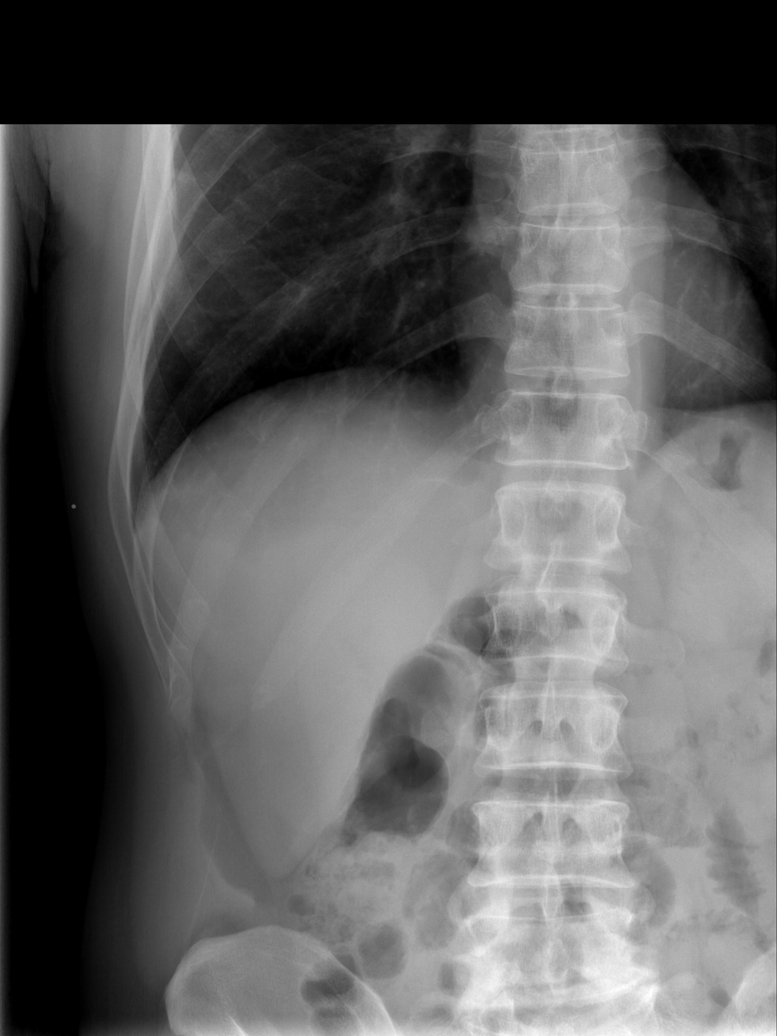

[w ribs ap/pa lower right]
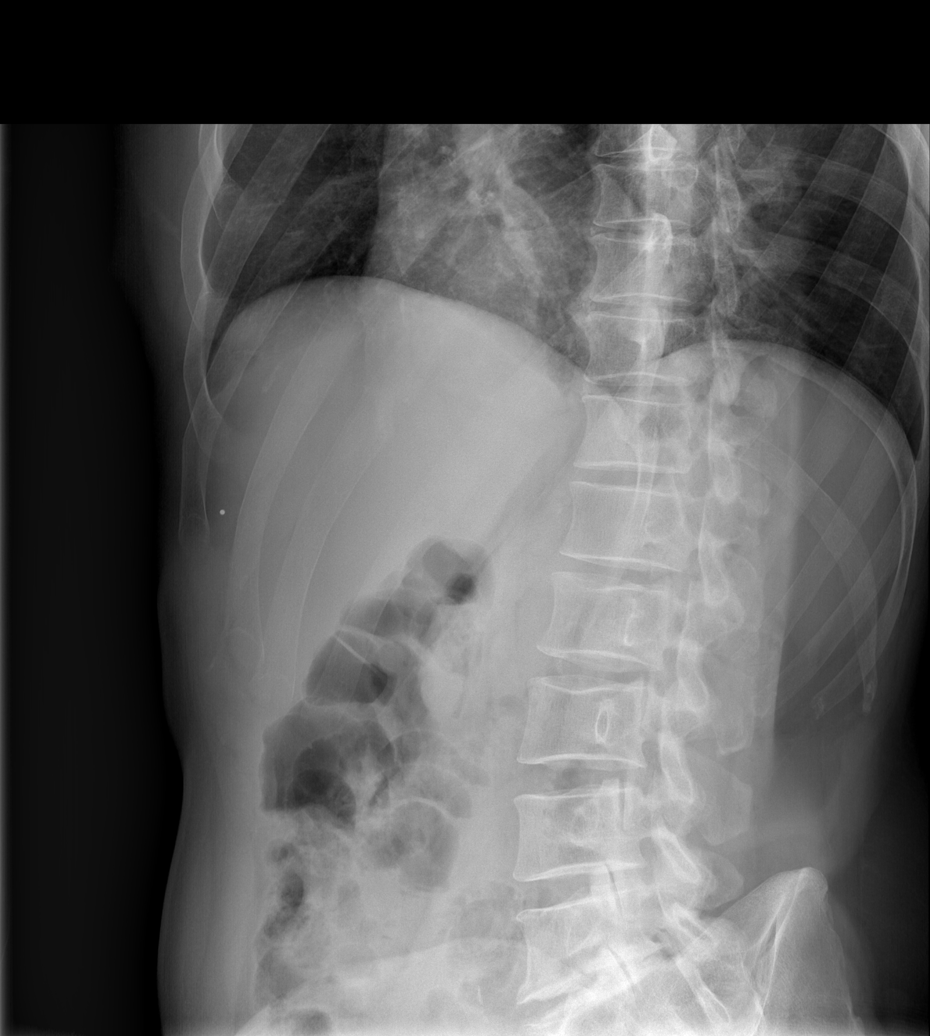

[w ribs oblique right]
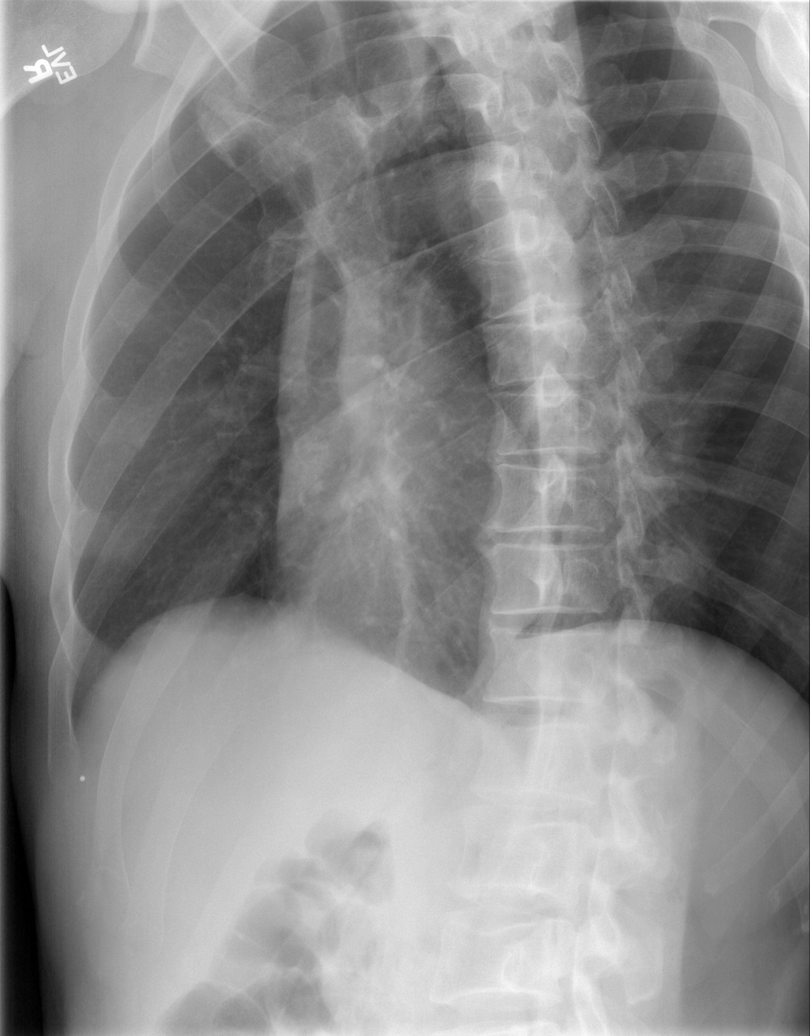

[5 of 5 positions shown; findings below may reference images not displayed]

FINDINGS: Acute nondisplaced posterolateral right seventh rib fracture. There
is no evidence of pneumothorax or pleural effusion. Both lungs are
clear. Heart size and mediastinal contours are within normal limits.
IMPRESSION: Acute nondisplaced posterolateral right seventh rib fracture.

## 2023-09-09 ENCOUNTER — Emergency Department (HOSPITAL_BASED_OUTPATIENT_CLINIC_OR_DEPARTMENT_OTHER): Payer: Medicaid Other

## 2023-09-09 ENCOUNTER — Encounter (HOSPITAL_BASED_OUTPATIENT_CLINIC_OR_DEPARTMENT_OTHER): Payer: Self-pay

## 2023-09-09 ENCOUNTER — Inpatient Hospital Stay (HOSPITAL_BASED_OUTPATIENT_CLINIC_OR_DEPARTMENT_OTHER)
Admission: EM | Admit: 2023-09-09 | Discharge: 2023-09-11 | DRG: 322 | Disposition: A | Discharge status: Home / Self Care | Payer: Medicaid Other | Attending: Cardiology | Admitting: Cardiology

## 2023-09-09 ENCOUNTER — Encounter (HOSPITAL_COMMUNITY)
Admission: EM | Disposition: A | Discharge status: Home / Self Care | Payer: Self-pay | Source: Home / Self Care | Attending: Cardiology

## 2023-09-09 ENCOUNTER — Other Ambulatory Visit: Payer: Self-pay

## 2023-09-09 ENCOUNTER — Other Ambulatory Visit (HOSPITAL_COMMUNITY): Payer: Self-pay

## 2023-09-09 DIAGNOSIS — I213 ST elevation (STEMI) myocardial infarction of unspecified site: Principal | ICD-10-CM

## 2023-09-09 DIAGNOSIS — Z955 Presence of coronary angioplasty implant and graft: Secondary | ICD-10-CM

## 2023-09-09 DIAGNOSIS — I2111 ST elevation (STEMI) myocardial infarction involving right coronary artery: Secondary | ICD-10-CM | POA: Diagnosis not present

## 2023-09-09 DIAGNOSIS — I2121 ST elevation (STEMI) myocardial infarction involving left circumflex coronary artery: Secondary | ICD-10-CM | POA: Diagnosis not present

## 2023-09-09 DIAGNOSIS — Z7902 Long term (current) use of antithrombotics/antiplatelets: Secondary | ICD-10-CM

## 2023-09-09 DIAGNOSIS — H9190 Unspecified hearing loss, unspecified ear: Secondary | ICD-10-CM | POA: Diagnosis not present

## 2023-09-09 DIAGNOSIS — Z8249 Family history of ischemic heart disease and other diseases of the circulatory system: Secondary | ICD-10-CM | POA: Diagnosis not present

## 2023-09-09 DIAGNOSIS — F17210 Nicotine dependence, cigarettes, uncomplicated: Secondary | ICD-10-CM | POA: Diagnosis present

## 2023-09-09 DIAGNOSIS — I472 Ventricular tachycardia, unspecified: Secondary | ICD-10-CM | POA: Diagnosis not present

## 2023-09-09 DIAGNOSIS — I2510 Atherosclerotic heart disease of native coronary artery without angina pectoris: Secondary | ICD-10-CM | POA: Diagnosis not present

## 2023-09-09 DIAGNOSIS — R079 Chest pain, unspecified: Secondary | ICD-10-CM | POA: Diagnosis not present

## 2023-09-09 DIAGNOSIS — Z79899 Other long term (current) drug therapy: Secondary | ICD-10-CM

## 2023-09-09 DIAGNOSIS — E785 Hyperlipidemia, unspecified: Secondary | ICD-10-CM | POA: Diagnosis not present

## 2023-09-09 DIAGNOSIS — Z86718 Personal history of other venous thrombosis and embolism: Secondary | ICD-10-CM | POA: Diagnosis not present

## 2023-09-09 DIAGNOSIS — I10 Essential (primary) hypertension: Secondary | ICD-10-CM

## 2023-09-09 DIAGNOSIS — Z7982 Long term (current) use of aspirin: Secondary | ICD-10-CM | POA: Diagnosis not present

## 2023-09-09 HISTORY — DX: Hyperlipidemia, unspecified: E78.5

## 2023-09-09 HISTORY — DX: Essential (primary) hypertension: I10

## 2023-09-09 HISTORY — PX: CORONARY/GRAFT ACUTE MI REVASCULARIZATION: CATH118305

## 2023-09-09 LAB — TROPONIN I (HIGH SENSITIVITY)
Troponin I (High Sensitivity): 84 ng/L — ABNORMAL HIGH (ref <18)
Troponin I (High Sensitivity): 3766 ng/L (ref <18)
Troponin I (High Sensitivity): 7894 ng/L (ref <18)

## 2023-09-09 LAB — CBC
HCT: 50.3 % (ref 39.0–52.0)
HCT: 48.4 % (ref 39.0–52.0)
Hemoglobin: 17.1 g/dL — ABNORMAL HIGH (ref 13.0–17.0)
Hemoglobin: 17 g/dL (ref 13.0–17.0)
MCH: 29.1 pg (ref 26.0–34.0)
MCH: 29.3 pg (ref 26.0–34.0)
MCHC: 34 g/dL (ref 30.0–36.0)
MCHC: 35.1 g/dL (ref 30.0–36.0)
MCV: 85.5 fL (ref 80.0–100.0)
MCV: 83.4 fL (ref 80.0–100.0)
Platelets: 337 10*3/uL (ref 150–400)
Platelets: 332 10*3/uL (ref 150–400)
RBC: 5.88 MIL/uL — ABNORMAL HIGH (ref 4.22–5.81)
RBC: 5.8 MIL/uL (ref 4.22–5.81)
RDW: 12.6 % (ref 11.5–15.5)
RDW: 12.6 % (ref 11.5–15.5)
WBC: 22 10*3/uL — ABNORMAL HIGH (ref 4.0–10.5)
WBC: 20.5 10*3/uL — ABNORMAL HIGH (ref 4.0–10.5)
nRBC: 0 % (ref 0.0–0.2)
nRBC: 0 % (ref 0.0–0.2)

## 2023-09-09 LAB — MRSA NEXT GEN BY PCR, NASAL: MRSA by PCR Next Gen: NOT DETECTED

## 2023-09-09 LAB — LIPID PANEL
Cholesterol: 165 mg/dL (ref 0–200)
HDL: 45 mg/dL (ref >40)
LDL Cholesterol: 96 mg/dL (ref 0–99)
Total CHOL/HDL Ratio: 3.7 {ratio}
Triglycerides: 122 mg/dL (ref <150)
VLDL: 24 mg/dL (ref 0–40)

## 2023-09-09 LAB — COMPREHENSIVE METABOLIC PANEL
ALT: 19 U/L (ref 0–44)
AST: 25 U/L (ref 15–41)
Albumin: 4.3 g/dL (ref 3.5–5.0)
Alkaline Phosphatase: 64 U/L (ref 38–126)
Anion gap: 11 (ref 5–15)
BUN: 20 mg/dL (ref 6–20)
CO2: 22 mmol/L (ref 22–32)
Calcium: 9.2 mg/dL (ref 8.9–10.3)
Chloride: 104 mmol/L (ref 98–111)
Creatinine, Ser: 1.14 mg/dL (ref 0.61–1.24)
GFR, Estimated: >60 mL/min (ref >60)
Glucose, Bld: 173 mg/dL — ABNORMAL HIGH (ref 70–99)
Potassium: 3.8 mmol/L (ref 3.5–5.1)
Sodium: 137 mmol/L (ref 135–145)
Total Bilirubin: 0.8 mg/dL (ref <1.2)
Total Protein: 7.5 g/dL (ref 6.5–8.1)

## 2023-09-09 LAB — PROTIME-INR
INR: 1 (ref 0.8–1.2)
Prothrombin Time: 13.1 s (ref 11.4–15.2)

## 2023-09-09 LAB — POCT ACTIVATED CLOTTING TIME
Activated Clotting Time: 395 s
Activated Clotting Time: 469 s

## 2023-09-09 LAB — HEMOGLOBIN A1C
Hgb A1c MFr Bld: 5.7 % — ABNORMAL HIGH (ref 4.8–5.6)
Hgb A1c MFr Bld: 5.9 % — ABNORMAL HIGH (ref 4.8–5.6)
Mean Plasma Glucose: 116.89 mg/dL
Mean Plasma Glucose: 122.63 mg/dL

## 2023-09-09 LAB — CREATININE, SERUM
Creatinine, Ser: 1.2 mg/dL (ref 0.61–1.24)
GFR, Estimated: >60 mL/min (ref >60)

## 2023-09-09 LAB — APTT: aPTT: 33 s (ref 24–36)

## 2023-09-09 LAB — HIV ANTIBODY (ROUTINE TESTING W REFLEX): HIV Screen 4th Generation wRfx: NONREACTIVE

## 2023-09-09 LAB — CG4 I-STAT (LACTIC ACID): Lactic Acid, Venous: 0.9 mmol/L (ref 0.5–1.9)

## 2023-09-09 SURGERY — CORONARY/GRAFT ACUTE MI REVASCULARIZATION
Anesthesia: LOCAL

## 2023-09-09 MED ORDER — CARVEDILOL 12.5 MG PO TABS
12.5000 mg | ORAL_TABLET | Freq: Two times a day (BID) | ORAL | Status: DC
  Administered 2023-09-09 – 2023-09-11 (×4): 12.5 mg via ORAL
  Filled 2023-09-09 (×4): qty 1

## 2023-09-09 MED ORDER — HYDRALAZINE HCL 20 MG/ML IJ SOLN
INTRAMUSCULAR | Status: DC | PRN
  Administered 2023-09-09: 10 mg via INTRAVENOUS

## 2023-09-09 MED ORDER — ORAL CARE MOUTH RINSE: 15.0000 mL | OROMUCOSAL | Status: DC | PRN

## 2023-09-09 MED ORDER — ENOXAPARIN SODIUM 40 MG/0.4ML IJ SOSY: 40.0000 mg | PREFILLED_SYRINGE | INTRAMUSCULAR | Status: DC

## 2023-09-09 MED ORDER — HEPARIN SODIUM (PORCINE) 5000 UNIT/ML IJ SOLN
60.0000 [IU]/kg | Freq: Once | INTRAMUSCULAR | Status: AC
  Administered 2023-09-09: 3750 [IU] via INTRAVENOUS
  Filled 2023-09-09: qty 1

## 2023-09-09 MED ORDER — LIDOCAINE HCL (PF) 1 % IJ SOLN
INTRAMUSCULAR | Status: AC
  Filled 2023-09-09: qty 30

## 2023-09-09 MED ORDER — TICAGRELOR 90 MG PO TABS
90.0000 mg | ORAL_TABLET | Freq: Two times a day (BID) | ORAL | Status: DC
  Administered 2023-09-09 – 2023-09-11 (×4): 90 mg via ORAL
  Filled 2023-09-09 (×4): qty 1

## 2023-09-09 MED ORDER — SODIUM CHLORIDE 0.9 % IV SOLN: 250.0000 mL | INTRAVENOUS | Status: AC | PRN

## 2023-09-09 MED ORDER — ACETAMINOPHEN 325 MG PO TABS: 650.0000 mg | ORAL_TABLET | ORAL | Status: DC | PRN

## 2023-09-09 MED ORDER — SODIUM CHLORIDE 0.9% FLUSH
3.0000 mL | Freq: Two times a day (BID) | INTRAVENOUS | Status: DC
  Administered 2023-09-09 – 2023-09-10 (×4): 3 mL via INTRAVENOUS

## 2023-09-09 MED ORDER — HEPARIN (PORCINE) 25000 UT/250ML-% IV SOLN
750.0000 [IU]/h | INTRAVENOUS | Status: DC
  Administered 2023-09-09: 750 [IU]/h via INTRAVENOUS
  Filled 2023-09-09: qty 250

## 2023-09-09 MED ORDER — LABETALOL HCL 5 MG/ML IV SOLN
10.0000 mg | INTRAVENOUS | Status: AC | PRN
Start: 2023-09-09 — End: 2023-09-09
  Administered 2023-09-09: 10 mg via INTRAVENOUS
  Filled 2023-09-09: qty 4

## 2023-09-09 MED ORDER — SODIUM CHLORIDE 0.9% FLUSH
3.0000 mL | INTRAVENOUS | Status: DC | PRN
Start: 2023-09-09 — End: 2023-09-11

## 2023-09-09 MED ORDER — LIDOCAINE HCL (PF) 1 % IJ SOLN
INTRAMUSCULAR | Status: DC | PRN
  Administered 2023-09-09: 2 mL

## 2023-09-09 MED ORDER — ASPIRIN 81 MG PO CHEW
324.0000 mg | CHEWABLE_TABLET | Freq: Once | ORAL | Status: AC
  Administered 2023-09-09: 324 mg via ORAL
  Filled 2023-09-09: qty 4

## 2023-09-09 MED ORDER — ASPIRIN 81 MG PO CHEW
81.0000 mg | CHEWABLE_TABLET | Freq: Every day | ORAL | Status: DC
  Administered 2023-09-10 – 2023-09-11 (×2): 81 mg via ORAL
  Filled 2023-09-09 (×2): qty 1

## 2023-09-09 MED ORDER — ONDANSETRON HCL 4 MG/2ML IJ SOLN: 4.0000 mg | Freq: Four times a day (QID) | INTRAMUSCULAR | Status: DC | PRN

## 2023-09-09 MED ORDER — TICAGRELOR 90 MG PO TABS
ORAL_TABLET | ORAL | Status: AC
  Filled 2023-09-09: qty 2

## 2023-09-09 MED ORDER — HEPARIN SODIUM (PORCINE) 1000 UNIT/ML IJ SOLN
INTRAMUSCULAR | Status: AC
  Filled 2023-09-09: qty 10

## 2023-09-09 MED ORDER — ENOXAPARIN SODIUM 40 MG/0.4ML IJ SOSY
40.0000 mg | PREFILLED_SYRINGE | INTRAMUSCULAR | Status: DC
Start: 2023-09-10 — End: 2023-09-11
  Administered 2023-09-10 – 2023-09-11 (×2): 40 mg via SUBCUTANEOUS
  Filled 2023-09-09 (×2): qty 0.4

## 2023-09-09 MED ORDER — NITROGLYCERIN 1 MG/10 ML FOR IR/CATH LAB
INTRA_ARTERIAL | Status: DC | PRN
  Administered 2023-09-09 (×2): 200 ug via INTRACORONARY

## 2023-09-09 MED ORDER — HYDRALAZINE HCL 20 MG/ML IJ SOLN
INTRAMUSCULAR | Status: AC
  Filled 2023-09-09: qty 1

## 2023-09-09 MED ORDER — VERAPAMIL HCL 2.5 MG/ML IV SOLN
INTRAVENOUS | Status: AC
  Filled 2023-09-09: qty 2

## 2023-09-09 MED ORDER — VERAPAMIL HCL 2.5 MG/ML IV SOLN
INTRAVENOUS | Status: DC | PRN
  Administered 2023-09-09: 10 mL via INTRA_ARTERIAL

## 2023-09-09 MED ORDER — ACETAMINOPHEN 325 MG PO TABS
650.0000 mg | ORAL_TABLET | ORAL | Status: DC | PRN
  Administered 2023-09-09: 650 mg via ORAL
  Filled 2023-09-09: qty 2

## 2023-09-09 MED ORDER — NITROGLYCERIN 0.4 MG SL SUBL: 0.4000 mg | SUBLINGUAL_TABLET | SUBLINGUAL | Status: DC | PRN

## 2023-09-09 MED ORDER — LOSARTAN POTASSIUM 50 MG PO TABS
50.0000 mg | ORAL_TABLET | Freq: Every day | ORAL | Status: DC
  Administered 2023-09-09: 50 mg via ORAL
  Filled 2023-09-09: qty 1

## 2023-09-09 MED ORDER — HEPARIN (PORCINE) IN NACL 1000-0.9 UT/500ML-% IV SOLN
INTRAVENOUS | Status: DC | PRN
  Administered 2023-09-09 (×2): 500 mL

## 2023-09-09 MED ORDER — HEPARIN SODIUM (PORCINE) 1000 UNIT/ML IJ SOLN
INTRAMUSCULAR | Status: DC | PRN
  Administered 2023-09-09: 6000 [IU] via INTRAVENOUS

## 2023-09-09 MED ORDER — IOHEXOL 350 MG/ML SOLN
INTRAVENOUS | Status: DC | PRN
  Administered 2023-09-09: 225 mL

## 2023-09-09 MED ORDER — ONDANSETRON HCL 4 MG/2ML IJ SOLN
4.0000 mg | Freq: Four times a day (QID) | INTRAMUSCULAR | Status: DC | PRN
Start: 2023-09-09 — End: 2023-09-11

## 2023-09-09 MED ORDER — SODIUM CHLORIDE 0.9 % IV SOLN
INTRAVENOUS | Status: AC
  Administered 2023-09-09: 10 mL/h via INTRAVENOUS

## 2023-09-09 MED ORDER — TICAGRELOR 90 MG PO TABS
ORAL_TABLET | ORAL | Status: DC | PRN
  Administered 2023-09-09: 180 mg via ORAL

## 2023-09-09 MED ORDER — HYDRALAZINE HCL 20 MG/ML IJ SOLN: 10.0000 mg | INTRAMUSCULAR | Status: AC | PRN

## 2023-09-09 MED ORDER — NITROGLYCERIN 1 MG/10 ML FOR IR/CATH LAB
INTRA_ARTERIAL | Status: AC
  Filled 2023-09-09: qty 10

## 2023-09-09 MED ORDER — ROSUVASTATIN CALCIUM 20 MG PO TABS
40.0000 mg | ORAL_TABLET | Freq: Every day | ORAL | Status: DC
  Administered 2023-09-09 – 2023-09-11 (×3): 40 mg via ORAL
  Filled 2023-09-09 (×3): qty 2

## 2023-09-09 MED ORDER — CHLORHEXIDINE GLUCONATE CLOTH 2 % EX PADS
6.0000 | MEDICATED_PAD | Freq: Every day | CUTANEOUS | Status: DC
  Administered 2023-09-09 – 2023-09-10 (×2): 6 via TOPICAL

## 2023-09-09 SURGICAL SUPPLY — 25 items
BALLN EMERGE MR 2.5X12 (BALLOONS) ×1 IMPLANT
BALLN ~~LOC~~ EMERGE MR 3.25X12 (BALLOONS) ×1 IMPLANT
BALLN ~~LOC~~ EMERGE MR 4.5X12 (BALLOONS) ×1 IMPLANT
BALLOON EMERGE MR 2.5X12 (BALLOONS) IMPLANT
BALLOON ~~LOC~~ EMERGE MR 3.25X12 (BALLOONS) IMPLANT
BALLOON ~~LOC~~ EMERGE MR 4.5X12 (BALLOONS) IMPLANT
CATH INFINITI 5 FR JL3.5 (CATHETERS) IMPLANT
CATH INFINITI JR4 5F (CATHETERS) IMPLANT
CATH LAUNCHER 6FR EBU3.5 (CATHETERS) IMPLANT
CATH LAUNCHER 6FR JR4 (CATHETERS) IMPLANT
DEVICE RAD COMP TR BAND LRG (VASCULAR PRODUCTS) IMPLANT
GLIDESHEATH SLEND SS 6F .021 (SHEATH) IMPLANT
GUIDEWIRE INQWIRE 1.5J.035X260 (WIRE) IMPLANT
INQWIRE 1.5J .035X260CM (WIRE) ×1 IMPLANT
KIT ENCORE 26 ADVANTAGE (KITS) IMPLANT
PACK CARDIAC CATHETERIZATION (CUSTOM PROCEDURE TRAY) ×1 IMPLANT
SET ATX-X65L (MISCELLANEOUS) IMPLANT
SHEATH PROBE COVER 6X72 (BAG) IMPLANT
STENT SYNERGY XD 3.0X16 (Permanent Stent) IMPLANT
STENT SYNERGY XD 4.0X32 (Permanent Stent) IMPLANT
SYNERGY XD 3.0X16 (Permanent Stent) ×1 IMPLANT
SYNERGY XD 4.0X32 (Permanent Stent) ×1 IMPLANT
TUBING CIL FLEX 10 FLL-RA (TUBING) IMPLANT
WIRE ASAHI PROWATER 180CM (WIRE) IMPLANT
WIRE RUNTHROUGH .014X180CM (WIRE) IMPLANT

## 2023-09-09 NOTE — Plan of Care (Signed)

## 2023-09-09 NOTE — ED Provider Notes (Signed)
EMERGENCY DEPARTMENT AT [facility] Provider Note  CSN: [id number] Arrival date & time: 09/09/23 4098  Chief Complaint(s) Chest Pain  HPI Colton White is a 57 y.o. male with past medical history as below, significant for prior MI, DVT, back surgery who presents to the ED with complaint of chest pain  He has been having intermittent chest pain over the past 2 weeks, worsened this morning, heaviness, pressure sensation midsternum, physical truck is sitting on his chest.  Feeling lightheaded, slightly diaphoretic, no nausea.  Pain is worsened since onset this morning.  Does not follow with PCP, does not take any regular medications.  He smokes cigarettes  Past Medical History Past Medical History:  Diagnosis Date   DVT (deep venous thrombosis) (HCC)    Hearing deficit    MI (myocardial infarction) (HCC)    There are no active problems to display for this patient.  Home Medication(s) Prior to Admission medications   Medication Sig Start Date End Date Taking? Authorizing Provider  diclofenac sodium (VOLTAREN) 1 % GEL Apply 4 g topically 4 (four) times daily. 02/09/18   Joy, Shawn C, PA-C  lidocaine (LIDODERM) 5 % Place 1 patch onto the skin daily. Remove & Discard patch within 12 hours or as directed by MD 02/09/18   Joy, Shawn C, PA-C  traMADol (ULTRAM) 50 MG tablet Take 1 tablet (50 mg total) by mouth every 6 (six) hours as needed. 02/09/18   Anselm Pancoast, PA-C                                                                                                                                    Past Surgical History Past Surgical History:  Procedure Laterality Date   BACK SURGERY     Family History History reviewed. No pertinent family history.  Social History Social History   Tobacco Use   Smoking status: Every Day    Current packs/day: 1.00    Types: Cigarettes   Smokeless tobacco: Never  Substance Use Topics   Alcohol use: No   Drug use: No    Allergies Patient has no known allergies.  Review of Systems Review of Systems  Constitutional:  Positive for diaphoresis.  Respiratory:  Positive for chest tightness. Negative for shortness of breath.   Cardiovascular:  Positive for chest pain. Negative for palpitations.  Gastrointestinal:  Positive for nausea. Negative for vomiting.  Genitourinary:  Negative for difficulty urinating.  Musculoskeletal:  Negative for arthralgias.  All other systems reviewed and are negative.   Physical Exam Vital Signs  I have reviewed the triage vital signs BP (!) 183/117   Pulse 76   Temp 98.2 F (36.8 C) (Oral)   Resp 13   Ht 5\' 8"  (1.727 m)   Wt 62.5 kg   SpO2 100%   BMI 20.95 kg/m  Physical Exam Vitals and nursing note reviewed.  Constitutional:  General: He is not in acute distress.    Appearance: He is well-developed. He is diaphoretic.  HENT:     Head: Normocephalic and atraumatic.     Right Ear: External ear normal.     Left Ear: External ear normal.     Mouth/Throat:     Mouth: Mucous membranes are moist.  Eyes:     General: No scleral icterus. Cardiovascular:     Rate and Rhythm: Normal rate and regular rhythm.     Pulses: Normal pulses.     Heart sounds: Normal heart sounds.     No S3 or S4 sounds.  Pulmonary:     Effort: Pulmonary effort is normal. No respiratory distress.     Breath sounds: Normal breath sounds.  Abdominal:     General: Abdomen is flat.     Palpations: Abdomen is soft.     Tenderness: There is no abdominal tenderness.  Musculoskeletal:     Cervical back: No rigidity.     Right lower leg: No edema.     Left lower leg: No edema.  Skin:    General: Skin is warm.     Capillary Refill: Capillary refill takes less than 2 seconds.  Neurological:     Mental Status: He is alert.  Psychiatric:        Mood and Affect: Mood normal.        Behavior: Behavior normal.     ED Results and Treatments Labs (all labs ordered are listed, but only  abnormal results are displayed) Labs Reviewed  CBC - Abnormal; Notable for the following components:      Result Value   WBC 22.0 (*)    RBC 5.88 (*)    Hemoglobin 17.1 (*)    All other components within normal limits  COMPREHENSIVE METABOLIC PANEL - Abnormal; Notable for the following components:   Glucose, Bld 173 (*)    All other components within normal limits  TROPONIN I (HIGH SENSITIVITY) - Abnormal; Notable for the following components:   Troponin I (High Sensitivity) 84 (*)    All other components within normal limits  PROTIME-INR  APTT  HEMOGLOBIN A1C  LIPID PANEL  I-STAT CG4 LACTIC ACID, ED                                                                                                                          Radiology No results found.  Pertinent labs & imaging results that were available during my care of the patient were reviewed by me and considered in my medical decision making (see MDM for details).  Medications Ordered in ED Medications  0.9 %  sodium chloride infusion (10 mL/hr Intravenous New Bag/Given 09/09/23 1000)  heparin ADULT infusion 100 units/mL (25000 units/269mL) (750 Units/hr Intravenous New Bag/Given 09/09/23 1008)  aspirin chewable tablet 324 mg (324 mg Oral Given 09/09/23 0956)  heparin injection 3,750 Units (3,750 Units Intravenous Given 09/09/23 0956)  Procedures .Critical Care  Performed by: Sloan Leiter, DO Authorized by: Sloan Leiter, DO   Critical care provider statement:    Critical care time (minutes):  30   Critical care time was exclusive of:  Separately billable procedures and treating other patients   Critical care was necessary to treat or prevent imminent or life-threatening deterioration of the following conditions:  Cardiac failure   Critical care was time spent personally by me on the  following activities:  Development of treatment plan with patient or surrogate, discussions with consultants, evaluation of patient's response to treatment, examination of patient, ordering and review of laboratory studies, ordering and review of radiographic studies, ordering and performing treatments and interventions, pulse oximetry, re-evaluation of patient's condition, review of old charts and obtaining history from patient or surrogate   Care discussed with: admitting provider     (including critical care time)  Medical Decision Making / ED Course    Medical Decision Making:    Thaddues Petrilli is a 57 y.o. male with past medical history as below, significant for prior MI, DVT, back surgery who presents to the ED with complaint of chest pain. The complaint involves an extensive differential diagnosis and also carries with it a high risk of complications and morbidity.  Serious etiology was considered. Ddx includes but is not limited to: Differential includes all life-threatening causes for chest pain. This includes but is not exclusive to acute coronary syndrome, aortic dissection, pulmonary embolism, cardiac tamponade, community-acquired pneumonia, pericarditis, musculoskeletal chest wall pain, etc.   Complete initial physical exam performed, notably the patient was in no acute stress, ST elevation noted on telemetry.    Reviewed and confirmed nursing documentation for past medical history, family history, social history.  Vital signs reviewed.     Clinical Course as of 09/09/23 1029  Wed Sep 09, 2023  2595 Inferior MI on EKG, he is having ongoing chest pain. Acutely symptomatic. Activate code stemi, give heparin bolus and start ASA. Awaiting callback from stemi doc [SG]  1006 Unable to reach Dr [place] or Dr Lynnette Caffey, both in the OR, spoke with [facility] who is working on finding doc  [SG]    Clinical Course User Index [SG] Sloan Leiter, DO    Brief summary: 57 yo male who does not  seek regular primary care here w/ chest pain, intermittent last 2 wks, worst today   On arrival EKG concerning for inferior MI.  Get posterior EKG which confirms inferior posterior MI.  Will activate code STEMI, contact cardiology;  give aspirin, give heparin bolus  Care like notified, emergency traffic, send to [facility] Lab at [facility].  Dr. Lynnette Caffey accepting. Pt / spouse agreeable to plan. Updated on findings.   [facility] at bedside to transport to cath lab         Additional history obtained: -Additional history obtained from spouse -External records from outside source obtained and reviewed including: Chart review including previous notes, labs, imaging, consultation notes including  Prior ED visits, prior labs and imaging   Lab Tests: -I ordered, reviewed, and interpreted labs.   The pertinent results include:   Labs Reviewed  CBC - Abnormal; Notable for the following components:      Result Value   WBC 22.0 (*)    RBC 5.88 (*)    Hemoglobin 17.1 (*)    All other components within normal limits  COMPREHENSIVE METABOLIC PANEL - Abnormal; Notable for the following components:   Glucose, Bld 173 (*)  All other components within normal limits  TROPONIN I (HIGH SENSITIVITY) - Abnormal; Notable for the following components:   Troponin I (High Sensitivity) 84 (*)    All other components within normal limits  PROTIME-INR  APTT  HEMOGLOBIN A1C  LIPID PANEL  I-STAT CG4 LACTIC ACID, ED    Notable for CBC and CMP resulted while still in the ER.  Troponin, lactic and lipid panel, A1c still pending.  WBC is elevated  EKG   EKG Interpretation Date/Time:  Wednesday September 09 2023 10:04:51 EST Ventricular Rate:  75 PR Interval:  112 QRS Duration:  117 QT Interval:  381 QTC Calculation: 426 R Axis:   88  Text Interpretation: Sinus rhythm Borderline short PR interval Nonspecific intraventricular conduction delay Inferoposterior infarct, acute (RCA) Anterolateral infarct,  acute Probable RV involvement, suggest recording right precordial leads Baseline wander in lead(s) V3 >>> Acute MI <<< similar to ekg just prior, stemi Confirmed by Tanda Rockers ([id number]) on 09/09/2023 10:05:43 AM         Imaging Studies ordered: I ordered imaging studies including CXR I independently visualized the following imaging with scope of interpretation limited to determining acute life threatening conditions related to emergency care; imaging appears stable, no large infiltrate or pneumothorax. Medicines ordered and prescription drug management: Meds ordered this encounter  Medications   0.9 %  sodium chloride infusion   aspirin chewable tablet 324 mg   heparin injection 3,750 Units   heparin ADULT infusion 100 units/mL (25000 units/227mL)    -I have reviewed the patients home medicines and have made adjustments as needed   Consultations Obtained: I requested consultation with the STEMI team,  and discussed lab and imaging findings as well as pertinent plan - they recommend: cath lab   Cardiac Monitoring: The patient was maintained on a cardiac monitor.  I personally viewed and interpreted the cardiac monitored which showed an underlying rhythm of: NSR, stemi Continuous pulse oximetry interpreted by myself, 100% on RA.    Social Determinants of Health:  Diagnosis or treatment significantly limited by social determinants of health: current smoker, no pcp Counseled patient for approximately 3 minutes regarding smoking cessation. Discussed risks of smoking and how they applied and affected their visit here today. Patient not ready to quit at this time, however will follow up with their primary doctor when they are.   CPT code: 13244: intermediate counseling for smoking cessation     Reevaluation: After the interventions noted above, I reevaluated the patient and found that they have stayed the same  Co morbidities that complicate the patient evaluation  Past Medical  History:  Diagnosis Date   DVT (deep venous thrombosis) (HCC)    Hearing deficit    MI (myocardial infarction) (HCC)       Dispostion: Disposition decision including need for hospitalization was considered, and patient admitted to the hospital.    Final Clinical Impression(s) / ED Diagnoses Final diagnoses:  ST elevation myocardial infarction (STEMI), unspecified artery (HCC)        Sloan Leiter, DO 09/09/23 1029

## 2023-09-09 NOTE — H&P (Signed)
Cardiology Admission History and Physical   Patient ID: Ronte Graveline MRN: [id number]; DOB: [birth year]   Admission date: 09/09/2023  PCP:  Oneita Hurt, No    [facility]Care Providers Cardiologist:  New, Dr. [place]  Chief Complaint:  STEMI  Patient Profile:   Ryanlee Babar is a 57 y.o. male with PMH of DVT who is being seen 09/09/2023 for the evaluation of STEMI.  History of Present Illness:   Mr. Musa is a 57 yo male with PMH noted above. Reports he has never seen a cardiologist in the past. Has been having intermittent episodes of chest pain for the past week. Reports typically resolved relatively quickly. Chest pain returned this morning and presented to [facility]. EKG there showed ST elevation in inferior leads with reciprocal changes in anterior leads. CODE STEMI called. Given 324 ASA and IV heparin prior to transport. He was taken directly to the cath lab on arrival his chest pain had resolved.    Past Medical History:  Diagnosis Date   DVT (deep venous thrombosis) (HCC)    Hearing deficit    MI (myocardial infarction) (HCC)     Past Surgical History:  Procedure Laterality Date   BACK SURGERY       Medications Prior to Admission: Prior to Admission medications   Medication Sig Start Date End Date Taking? Authorizing Provider  diclofenac sodium (VOLTAREN) 1 % GEL Apply 4 g topically 4 (four) times daily. 02/09/18   Joy, Shawn C, PA-C  lidocaine (LIDODERM) 5 % Place 1 patch onto the skin daily. Remove & Discard patch within 12 hours or as directed by MD 02/09/18   Joy, Shawn C, PA-C  traMADol (ULTRAM) 50 MG tablet Take 1 tablet (50 mg total) by mouth every 6 (six) hours as needed. 02/09/18   Joy, Hillard Danker, PA-C     Allergies:   No Known Allergies  Social History:   Social History   Socioeconomic History   Marital status: Married    Spouse name: Not on file   Number of children: Not on file   Years of education: Not on file   Highest education level: Not on file   Occupational History   Not on file  Tobacco Use   Smoking status: Every Day    Current packs/day: 1.00    Types: Cigarettes   Smokeless tobacco: Never  Substance and Sexual Activity   Alcohol use: No   Drug use: No   Sexual activity: Not on file  Other Topics Concern   Not on file  Social History Narrative   Not on file   Social Drivers of Health   Financial Resource Strain: Not on file  Food Insecurity: Not on file  Transportation Needs: Not on file  Physical Activity: Not on file  Stress: Not on file  Social Connections: Not on file  Intimate Partner Violence: Not on file    Family History:   The patient's family history includes Hypertension in his father.    ROS:  Please see the history of present illness.  All other ROS reviewed and negative.     Physical Exam/Data:   Vitals:   09/09/23 0945 09/09/23 1000 09/09/23 1015 09/09/23 1051  BP: (!) 195/113 (!) 183/117    Pulse: 64  76   Resp: 14 18 13    Temp:   98.2 F (36.8 C)   TempSrc:   Oral   SpO2: 100%  100% 100%  Weight:      Height:  No intake or output data in the 24 hours ending 09/09/23 1105    09/09/2023    9:42 AM 02/09/2018    5:58 PM 04/11/2016    9:36 PM  Last 3 Weights  Weight (lbs) 137 lb 12.8 oz 160 lb 150 lb  Weight (kg) 62.506 kg 72.576 kg 68.04 kg     Body mass index is 20.95 kg/m.  General:  Well nourished, well developed, in no acute distress HEENT: normal Neck: no JVD Vascular: No carotid bruits; Distal pulses 2+ bilaterally   Cardiac:  normal S1, S2; RRR; no murmur  Lungs:  clear to auscultation bilaterally, no wheezing, rhonchi or rales  Abd: soft, nontender, no hepatomegaly  Ext: no edema Musculoskeletal:  No deformities, BUE and BLE strength normal and equal Skin: warm and dry  Neuro:  CNs 2-12 intact, no focal abnormalities noted Psych:  Normal affect    EKG:  The ECG that was done 09/09/2023 was personally reviewed and demonstrates sinus Rhythm, ST elevation in  inferior leads with reciprocal changes in anterior leads  Relevant CV Studies:  N/a  Laboratory Data:  High Sensitivity Troponin:   Recent Labs  Lab 09/09/23 0948  TROPONINIHS 84*      Chemistry Recent Labs  Lab 09/09/23 0953  NA 137  K 3.8  CL 104  CO2 22  GLUCOSE 173*  BUN 20  CREATININE 1.14  CALCIUM 9.2  GFRNONAA >60  ANIONGAP 11    Recent Labs  Lab 09/09/23 0953  PROT 7.5  ALBUMIN 4.3  AST 25  ALT 19  ALKPHOS 64  BILITOT 0.8   Lipids No results for input(s): "CHOL", "TRIG", "HDL", "LABVLDL", "LDLCALC", "CHOLHDL" in the last 168 hours. Hematology Recent Labs  Lab 09/09/23 0948  WBC 22.0*  RBC 5.88*  HGB 17.1*  HCT 50.3  MCV 85.5  MCH 29.1  MCHC 34.0  RDW 12.6  PLT 337   Thyroid No results for input(s): "TSH", "FREET4" in the last 168 hours. BNPNo results for input(s): "BNP", "PROBNP" in the last 168 hours.  DDimer No results for input(s): "DDIMER" in the last 168 hours.   Radiology/Studies:  No results found.   Assessment and Plan:   Yarden Kee is a 57 y.o. male with PMH of DVT who is being seen 09/09/2023 for the evaluation of STEMI.  STEMI -- presented to the ED with one week of intermittent chest pain, worsening the morning of presentation. EKG with acute ST elevation in inferior leads. CODE STEMI called. Given ASA, IV heparin and transferred to the cath lab emergently -- further recommendations post cath, anticipate routine post MI care   Risk Assessment/Risk Scores:   TIMI Risk Score for ST  Elevation MI:   The patient's TIMI risk score is 1, which indicates a 1.6% risk of all cause mortality at 30 days.  Code Status: Full Code  Severity of Illness: The appropriate patient status for this patient is INPATIENT. Inpatient status is judged to be reasonable and necessary in order to provide the required intensity of service to ensure the patient's safety. The patient's presenting symptoms, physical exam findings, and initial  radiographic and laboratory data in the context of their chronic comorbidities is felt to place them at high risk for further clinical deterioration. Furthermore, it is not anticipated that the patient will be medically stable for discharge from the hospital within 2 midnights of admission.   * I certify that at the point of admission it is my clinical judgment that the  patient will require inpatient hospital care spanning beyond 2 midnights from the point of admission due to high intensity of service, high risk for further deterioration and high frequency of surveillance required.*   For questions or updates, please contact [facility] Please consult [email or web address] for contact info under     Signed, Laverda Page, NP  09/09/2023 11:05 AM

## 2023-09-09 NOTE — ED Notes (Signed)
[facility] at bedside for pt ready for transport

## 2023-09-09 NOTE — ED Notes (Signed)
Pt states that he took on 81 mg aspirin at home before coming to the ER.

## 2023-09-09 NOTE — ED Notes (Signed)
[facility] in to transport   Report called to [place] in cath lab

## 2023-09-09 NOTE — Progress Notes (Signed)
PHARMACY - ANTICOAGULATION CONSULT NOTE  Pharmacy Consult for heparin Indication: chest pain/ACS  No Known Allergies  Patient Measurements: Height: 5\' 8"  (172.7 cm) Weight: 62.5 kg (137 lb 12.8 oz) IBW/kg (Calculated) : 68.4 Heparin Dosing Weight: 62.5kg  Vital Signs: BP: 195/113 (12/18 0945) Pulse Rate: 64 (12/18 0945)  Labs: Recent Labs    09/09/23 0948  HGB 17.1*  HCT 50.3  PLT 337    CrCl cannot be calculated (Patient's most recent lab result is older than the maximum 21 days allowed.).   Medical History: Past Medical History:  Diagnosis Date   DVT (deep venous thrombosis) (HCC)    Hearing deficit    MI (myocardial infarction) (HCC)     Medications:  Infusions:   sodium chloride 10 mL/hr (09/09/23 1000)   heparin 750 Units/hr (09/09/23 1008)    Assessment: 57 yom presented to the ED with CP. Code STEMI called and now initiating IV heparin. Baseline CBC is ok and he is not on anticoagulation PTA.   Goal of Therapy:  Heparin level 0.3-0.7 units/ml Monitor platelets by anticoagulation protocol: Yes   Plan:  Heparin bolus 3750 units IV x 1 per MD Heparin gtt 750 units/hr Check a 6 hr heparin level if no plans for cardiac cath  Colton White, Drake Leach 09/09/2023,10:01 AM

## 2023-09-09 NOTE — ED Triage Notes (Signed)
Pt reports that he had had chest pain off and on for about a month. Denies SOB and difficulty breathing

## 2023-09-10 ENCOUNTER — Inpatient Hospital Stay (HOSPITAL_COMMUNITY): Payer: Medicaid Other

## 2023-09-10 DIAGNOSIS — I2121 ST elevation (STEMI) myocardial infarction involving left circumflex coronary artery: Secondary | ICD-10-CM

## 2023-09-10 DIAGNOSIS — Z006 Encounter for examination for normal comparison and control in clinical research program: Secondary | ICD-10-CM

## 2023-09-10 LAB — ECHOCARDIOGRAM COMPLETE
AR max vel: 2.35 cm2
AV Area VTI: 3.05 cm2
AV Area mean vel: 2.18 cm2
AV Mean grad: 2 mm[Hg]
AV Peak grad: 3.6 mm[Hg]
Ao pk vel: 0.95 m/s
Area-P 1/2: 2.56 cm2
Height: 68 in
S' Lateral: 2.8 cm
Weight: 2204.8 [oz_av]

## 2023-09-10 LAB — CBC WITH DIFFERENTIAL/PLATELET
Abs Immature Granulocytes: 0.07 10*3/uL (ref 0.00–0.07)
Basophils Absolute: 0.1 10*3/uL (ref 0.0–0.1)
Basophils Relative: 1 %
Eosinophils Absolute: 0.1 10*3/uL (ref 0.0–0.5)
Eosinophils Relative: 1 %
HCT: 49.8 % (ref 39.0–52.0)
Hemoglobin: 16.9 g/dL (ref 13.0–17.0)
Immature Granulocytes: 0 %
Lymphocytes Relative: 15 %
Lymphs Abs: 2.8 10*3/uL (ref 0.7–4.0)
MCH: 28.9 pg (ref 26.0–34.0)
MCHC: 33.9 g/dL (ref 30.0–36.0)
MCV: 85.1 fL (ref 80.0–100.0)
Monocytes Absolute: 1.5 10*3/uL — ABNORMAL HIGH (ref 0.1–1.0)
Monocytes Relative: 8 %
Neutro Abs: 14.8 10*3/uL — ABNORMAL HIGH (ref 1.7–7.7)
Neutrophils Relative %: 75 %
Platelets: 328 10*3/uL (ref 150–400)
RBC: 5.85 MIL/uL — ABNORMAL HIGH (ref 4.22–5.81)
RDW: 12.7 % (ref 11.5–15.5)
WBC: 19.4 10*3/uL — ABNORMAL HIGH (ref 4.0–10.5)
nRBC: 0 % (ref 0.0–0.2)

## 2023-09-10 LAB — CBC
HCT: 49.6 % (ref 39.0–52.0)
Hemoglobin: 17 g/dL (ref 13.0–17.0)
MCH: 29 pg (ref 26.0–34.0)
MCHC: 34.3 g/dL (ref 30.0–36.0)
MCV: 84.5 fL (ref 80.0–100.0)
Platelets: 323 10*3/uL (ref 150–400)
RBC: 5.87 MIL/uL — ABNORMAL HIGH (ref 4.22–5.81)
RDW: 12.5 % (ref 11.5–15.5)
WBC: 18.9 10*3/uL — ABNORMAL HIGH (ref 4.0–10.5)
nRBC: 0 % (ref 0.0–0.2)

## 2023-09-10 LAB — LIPID PANEL
Cholesterol: 143 mg/dL (ref 0–200)
HDL: 46 mg/dL (ref >40)
LDL Cholesterol: 85 mg/dL (ref 0–99)
Total CHOL/HDL Ratio: 3.1 {ratio}
Triglycerides: 60 mg/dL (ref <150)
VLDL: 12 mg/dL (ref 0–40)

## 2023-09-10 LAB — BASIC METABOLIC PANEL
Anion gap: 11 (ref 5–15)
BUN: 13 mg/dL (ref 6–20)
CO2: 22 mmol/L (ref 22–32)
Calcium: 9.9 mg/dL (ref 8.9–10.3)
Chloride: 104 mmol/L (ref 98–111)
Creatinine, Ser: 1.14 mg/dL (ref 0.61–1.24)
GFR, Estimated: >60 mL/min (ref >60)
Glucose, Bld: 124 mg/dL — ABNORMAL HIGH (ref 70–99)
Potassium: 4 mmol/L (ref 3.5–5.1)
Sodium: 137 mmol/L (ref 135–145)

## 2023-09-10 LAB — MAGNESIUM: Magnesium: 1.9 mg/dL (ref 1.7–2.4)

## 2023-09-10 MED ORDER — LOSARTAN POTASSIUM 25 MG PO TABS
25.0000 mg | ORAL_TABLET | Freq: Every day | ORAL | Status: DC
  Administered 2023-09-11: 25 mg via ORAL
  Filled 2023-09-10: qty 1

## 2023-09-10 MED ORDER — STUDY - ARTEMIS - ZILTIVEKIMAB 15 MG/0.5 ML OR PLACEBO SQ INJECTION (PI-CHRISTOPHER)
30.0000 mg | INJECTION | Freq: Once | SUBCUTANEOUS | Status: AC
  Administered 2023-09-10: 30 mg via SUBCUTANEOUS
  Filled 2023-09-10: qty 1

## 2023-09-10 NOTE — Progress Notes (Addendum)
   Patient Name: Colton White Date of Encounter: 09/10/2023 [facility] Cardiologist: [place]  Interval Summary  .    No acute events overnight    Feels well  NSVT x 1  Vital Signs .    Vitals:   09/10/23 0700 09/10/23 0749 09/10/23 0758 09/10/23 0800  BP: 112/79  103/76 97/72  Pulse: 71  72 70  Resp: 16   (!) 26  Temp:  98.2 F (36.8 C)    TempSrc:  Oral    SpO2: 98%   98%  Weight:      Height:        Intake/Output Summary (Last 24 hours) at 09/10/2023 0807 Last data filed at 09/10/2023 0800 Gross per 24 hour  Intake 620.66 ml  Output 1750 ml  Net -1129.34 ml      09/09/2023    9:42 AM 02/09/2018    5:58 PM 04/11/2016    9:36 PM  Last 3 Weights  Weight (lbs) 137 lb 12.8 oz 160 lb 150 lb  Weight (kg) 62.506 kg 72.576 kg 68.04 kg      Telemetry/ECG    NSVT x 1, OTW NSR - Personally Reviewed  Physical Exam .   GEN: No acute distress.   Neck: No JVD Cardiac: RRR, no murmurs, rubs, or gallops.  Respiratory: Clear to auscultation bilaterally. GI: Soft, nontender, non-distended  MS: No edema  Assessment & Plan .     STEMI:  Cont DAPT, high dose atorvastatin, Coreg 125mg  bid, PRN NTG, losartan.  F/U TTE today.  Transfer out of unit  Pharmacy to determine Ticagrelor coverage. NSVT:  K stable, will check mag today.  Cont coreg 125mg  bid.   HTN:  Decrease losartan to 25mg  given marginal BP HL:  Cont high dose statin Dispo:  Transfer out of unit today, likely d/c in AM with close follow up.   For questions or updates, please contact [facility] Please consult [email or web address] for contact info under        Signed, Orbie Pyo, MD

## 2023-09-10 NOTE — Research (Cosign Needed)
ARTEMIS Screening/Randomization Visit (V1/V2)  Subject Name: Colton White  Subject met inclusion and exclusion criteria.  The informed consent form, study requirements and expectations were reviewed with the subject and questions and concerns were addressed prior to the signing of the consent form.  The subject verbalized understanding of the trial requirements.  The subject agreed to participate in the Artemis trial and signed the informed consent on 19/Dec/2024.  The informed consent was obtained prior to performance of any protocol-specific procedures for the subject.  A copy of the signed informed consent was given to the subject and a copy was placed in the subject's medical record.    Were all inclusion criteria met? [x] Yes [] No  Informed consent obtained before any study-related activities. Study-related activities are any  procedures that are carried out as part of the study, including activities to determine suitability  for the study. [x] Yes [] No 2.  Age 57 years or above at the time of signing the informed consent. [x] Yes [] No  3.  Hospitalizations for acute myocardial infarction with evidence of type 1 MIb,13 by invasive  angiography performed at site with PCI capabilities. [x] Yes [] No ? ST-segment elevation myocardial infarction with all the following: o Relevant symptoms suggestive of cardiac ischaemia within 12 hours before hospitalisation or during hospitalisation. o ECG-changes (in the absence of left ventricular hypertrophy or left bundle branch  block): ST-segment elevation at the J point in at least two contiguous leads  >=0.25 mV in men <40 years, >=0.2 mV in men >=40 years, or >=0.15 mV in women in  leads V2-V3; and/or >=0.1 mV in all other leads. or ? Non-ST-segment myocardial infarction with all the following: o Relevant symptoms suggestive of cardiac ischaemia within 24 hours before hospitalisation or during hospitalisation.  4. Rise and/or fall in cardiac  troponin I or T with at least one value above the 99th percentile upper reference limit. [x] Yes [] No 5. Possibility for randomisation as early as possible after invasive procedure, and latest within  36 hours of hospitalisation (time 0) for STEMI, and latest within 48 hours of hospitalisation  (time 0) for NSTEMI. [x] Yes [] No  6. Presence of at least one of the following criteria (confirmed based on the participant's medical  records and/or medical history interview): [x] Yes [] No []  Any prior MI. [] Prior coronary revascularisation. [] Diabetes mellitus treated with glucose-lowering agent(s). [] Known CKD (eGFR >=15 and <60 mL/min/1.73 m2 [] Prior ischaemic stroke. []  Known carotid disease or peripheral artery disease in the lower extremities. [x] Multivessel coronary artery disease (current/prior). [] For STEMI patients only: anterior MI at index AMI.   7. Angiographic signs supporting the clinical suspicion of a type 1 MI including but not limited to: [x] Yes [] No ? A flow-limiting stenosis not present at the site of a prior stent. ? A relevant stenosis with features suggesting a thrombus/plaque rupture such as luminal  irregularities, thrombus-looking appearance, or haziness. Type 1 MI excludes type 2 MI (including but not limited to significant gastrointestinal bleedings,  anaemia and sepsis), and type 4 MI (a-c: periprocedural (PCI) MI, stent thrombosis and in-stent  restenosis) and type 5 MI (periprocedural (CABG) MI). Interpretation of the above is at the  discretion of the investigator. c The documented presence of a coronary stent on the coronary angiogram performed for the current  (index) AMI is acceptable to define prior coronary revascularisation. d Defined as >=50% stenosis on 2 or more epicardial artery territories or the left main artery during a  prior cardiac catheterisation or cardiac catheterisation during the current AMI, or  prior percutaneous  coronary intervention (PCI)  and >=50% stenosis of at least 1 epicardial artery territory different from  the prior revascularised artery, or prior multivessel coronary bypass grafting. e The anterior ST-elevation myocardial infarction is documented during the index AMI by relevant  electrocardiographic changes such as, but not limited to, ST-elevations in V1-V4.   Were all exclusion criteria met? [x] Yes [] No  General 1. Known or suspected hypersensitivity to study intervention or related products. [] Yes [x] No 2. Previous participation in this study. Participation is defined as randomisation. [] Yes [x] No 3. Male who is pregnant, breast-feeding or intends to become pregnant or is of childbearing  potential and not using an adequate and highly effective contraceptive method (adequate  contraceptive measures as required by local regulation or practice), as defined in Appendix 4,  Section 10.4.2. [] Yes [x] No 4. Participation (i.e., signed informed consent) in any other interventional clinical study of an  approved or non-approved investigational medicinal product within the past 30 days. 5. Any disorder, which in the investigator's opinion might jeopardise participant's safety or  compliance with the protocol. [] Yes [x] No 6. Absolute neutrophil count <2109/L. [] Yes [x] No 7. Platelet count <120109/L. [] Yes [x] No 8. ALT >8 x ULN. [] Yes [x] No a Medical conditions - cardiac and risk factors 9. Use of fibrinolytic therapy for treatment of the current AMI. [] Yes [x] No 10. Chronic heart failure classified as being in [place] Heart Association (NYHA) Class IV. [] Yes [x] No 11. Ongoing haemodynamic instability defined as any of the following:  ? Killip Class III or IV. [] Yes [x] No ? Sustained and/or symptomatic hypotension (systolic blood pressure <90 mmHg). [] Yes [x] No 12. Severe kidney impairment defined as any of the following:  [] Yes [x] No ? Previous or current eGFR<15 mL/min/1.73 m2  . ? Chronic haemodialysis or  peritoneal dialysis.  13. Severe hepatic disease defined as at least one of the following: [] Yes [x] No ? Previously known or current hepatic encephalopathy (clinical evaluation). ? Previously known or current ascites (clinical evaluation). ? Jaundice (clinical evaluation). ? Previous oesophageal/gastric variceal bleeding. ? Known hepatic cirrhosis. 14. Major cardiac surgical (including but not restricted to coronary artery bypass graft surgery  [CABG]), non-cardiac surgical, or major endoscopic procedure (thoracoscopic or laparoscopic) within the past 60 days or any major surgical procedure planned at the time of randomisation or as treatment for the current AMI (CABG). Deferred (staged) percutaneous  coronary intervention for a non-culprit vessel identified during the current AMI is allowed. [] Yes [x] No Medical conditions - infections 15. History of recurrent serious infections (infections leading to hospitalisation or use of i.v.  antibiotics) within the past 12 months, at the discretion of the investigator. [] Yes [x] No 16. Clinical evidence of, or suspicion of, active infection at the discretion of the investigator. [] Yes [x] No 17. Known (acute or chronic) hepatitis B or hepatitis C. [] Yes [x] No 18. History or evidence of untreated latent tuberculosis (TB) such as (but not limited to): [] Yes [x] No ? History or evidence of a positive TB test or chest X-ray compatible with latent TB; and TB  treatment initiated less than 28 days prior to randomisation. ? Participants with TB risk factors (see details in Section 8.2.6) unwilling to undergo TB  treatment if confirmed positive for latent TB based on central laboratory test at baseline  (visit 2). 19. Diagnosis of human immunodeficiency virus (HIV) and not receiving a stable antiretroviral  regimen, at the discretion of the investigator. [] Yes [x] No 20. History of gastrointestinal perforation (Note: History of perforated appendicitis more than   57 years old is not  exclusionary). [] Yes [x] No 21. History of active diverticulitis within the past 5 years. [] Yes [x] No 22. History of inflammatory bowel disease that has been clinically active within the past  12 months. [] Yes [x] No Medical conditions - other 23. Presence or history of malignant neoplasms or in situ carcinomas (other than basal or  squamous cell skin cancer, low risk prostate cancer, or in situ carcinomas of the cervix, or  carcinoma in situ/high grade prostatic intraepithelial neoplasia (PIN)) within the past 5 years. [] Yes [x] No 24. History of bone marrow or solid organ transplant or anticipated to receive an organ transplant  during the study. Note: Patients no longer receiving immune suppressant therapy and who  are in full remission following bone marrow transplant can be included in the study. [] Yes [x] No 25. Received a live or attenuated-live vaccine product within 4 weeks of study intervention administration or expected to receive a live or attenuated-live vaccine product during the  treatment period. (Note: Not-live and not attenuated-live vaccines are not exclusionary. For  guidance refer to Appendix 8, Section 10.8). [] Yes [x] No 26. Use of preventive systemic antibiotics, systemic antivirals, or systemic antifungals. (Note:  "Systemic" is defined as oral or i.v. administered drugs that are absorbed into the  circulation. Antibiotics used to treat latent TB are exempted). [] Yes [x] No 27. Use of systemic immunosuppressive drugs (both small molecules and biologics) or disease  modifying anti-rheumatic drugs (DMARDs including both biologic DMARDs like anti-TNFalpha and conventional DMARDs like methotrexate) or anticipated chronic use of such drugs  any time during the study. (Note: Use of otic, ophthalmic, inhaled, and topical corticosteroids  or local corticosteroid injections are not exclusionary. Furthermore, temporary systemic  immunosuppressive treatment of e.g.,  chronic obstructive pulmonary disease [COPD]  exacerbations is allowed). [] Yes [x] No 28. Use of anti-IL-6 products or anticipated use of such drugs any time during the study. a Participants with increased levels of ALT <=8 x ULN are eligible at the discretion of the  investigator if the investigator considers the ALT elevations to be caused by the current AMI. In  case the elevation is considered related to other reasons than the current AMI, participants should be  excluded when ALT >2.5 x ULN. [] Yes [x] No b Endoscopic procedure for screening purposes such as gastroscopy and colonoscopy are allowed if  there is no suspicion of malignant disease driving the referral.    Randomization What was the date of randomization?: 19/Dec/2024 What was the time of randomization?: 1326   VS: Subject resting for >5 mins before VS taken.  Time: 1447 1st: BP: 115/73 HR:87 Time: 1450 2nd: BP: 112/72 HR:84 Time: 1455 3rd: BP: 116/80 HR: 92   Physical Exam Was the physical examination performed? [x] Yes [] No Was the examination date the same as the visit date? [x] Yes [] No   Hep B/C Central labs drawn: [x] Yes [] No    Central Lab assessments drawn: [x] Yes [] No   Hs-CRP, IL-6, Lipids, biochemistry/hematology, hbA1C, PK sampling, immunogenicity assessments   Was TB screening performed for subject? [x] Yes [] No [] N/A  Did subject have TB risk factors: [] Yes [x] No [] N/A  TB Central lab test in subjects with risk factors: [] Yes [] No [x] N/A   Pregnancy Test Was the sample collected? [] Yes [] No [x] N/A Was the collection date the same as the visit date? [] Yes [] No Specimen Type: [] Serum [] Urine Collection Date: Collection Time: Pregnancy Test Result: [] Positive [] Negative [] Borderline [] Invalid   Study Drug Administration Was dose administered?  [x] Yes [] No Time dose administered: 1442  Both autoinjectors given in L anterior thigh at 1442  without complication. Kit# [id number]. Box returned to  pharmacy, both autoinjectors disposed in sharps container.    EQ-5D-5L Assessment Completed? [x] Yes [] No Reason not done: [] Subject Forgot [] Subject too ill [] Subject refused [] Technical failure [] Other If Other, Specify Collection Date: 19/Dec/2024   Subject ID card given: [x] Yes [] No  No current facility-administered medications for this visit.  Current Outpatient Medications:    aspirin 81 MG chewable tablet, Chew 1 tablet (81 mg total) by mouth daily., Disp: 90 tablet, Rfl: 2   carvedilol (COREG) 12.5 MG tablet, Take 1 tablet (12.5 mg total) by mouth 2 (two) times daily with a meal., Disp: 180 tablet, Rfl: 1   losartan (COZAAR) 25 MG tablet, Take 1 tablet (25 mg total) by mouth daily., Disp: 90 tablet, Rfl: 1   nitroGLYCERIN (NITROSTAT) 0.4 MG SL tablet, Place 1 tablet (0.4 mg total) under the tongue every 5 (five) minutes x 3 doses as needed for chest pain., Disp: 25 tablet, Rfl: 2   rosuvastatin (CRESTOR) 40 MG tablet, Take 1 tablet (40 mg total) by mouth daily., Disp: 90 tablet, Rfl: 1   Study - ARTEMIS - ziltivekimab 15 mg/0.5 mL or placebo SQ injection (PI-Christopher), Inject 0.5 mLs (15 mg total) into the skin every 28 (twenty-eight) days. Investigational Study Drug. Please contact the [facility] Research team for any questions or concerns regarding this medication., Disp: 0.5 mL, Rfl: 0   ticagrelor (BRILINTA) 90 MG TABS tablet, Take 1 tablet (90 mg total) by mouth 2 (two) times daily., Disp: 180 tablet, Rfl: 2  Facility-Administered Medications Ordered in Other Visits:    acetaminophen (TYLENOL) tablet 650 mg, 650 mg, Oral, Q4H PRN, [place], Peter M, MD, 650 mg at 09/09/23 2021   aspirin chewable tablet 81 mg, 81 mg, Oral, Daily, [place], Peter M, MD, 81 mg at 09/11/23 0846   carvedilol (COREG) tablet 12.5 mg, 12.5 mg, Oral, BID WC, [place], Peter M, MD, 12.5 mg at 09/11/23 8657   Chlorhexidine Gluconate Cloth 2 % PADS 6 each, 6 each, Topical, Daily, [place], Peter M, MD, 6 each at  09/10/23 1019   enoxaparin (LOVENOX) injection 40 mg, 40 mg, Subcutaneous, Q24H, [place], Peter M, MD, 40 mg at 09/11/23 0847   losartan (COZAAR) tablet 25 mg, 25 mg, Oral, Daily, Orbie Pyo, MD, 25 mg at 09/11/23 0846   magnesium sulfate IVPB 2 g 50 mL, 2 g, Intravenous, Once, Laverda Page B, NP, Last Rate: 50 mL/hr at 09/11/23 0852, 2 g at 09/11/23 0852   nitroGLYCERIN (NITROSTAT) SL tablet 0.4 mg, 0.4 mg, Sublingual, Q5 Min x 3 PRN, [place], Peter M, MD   ondansetron [facility]) injection 4 mg, 4 mg, Intravenous, Q6H PRN, [place], Peter M, MD   Oral care mouth rinse, 15 mL, Mouth Rinse, PRN, [place], Peter M, MD   rosuvastatin (CRESTOR) tablet 40 mg, 40 mg, Oral, Daily, [place], Peter M, MD, 40 mg at 09/11/23 0845   sodium chloride flush (NS) 0.9 % injection 3 mL, 3 mL, Intravenous, Q12H, [place], Peter M, MD, 3 mL at 09/10/23 2145   sodium chloride flush (NS) 0.9 % injection 3 mL, 3 mL, Intravenous, PRN, [place], Peter M, MD   ticagrelor [facility]) tablet 90 mg, 90 mg, Oral, BID, [place], Peter M, MD, 90 mg at 09/11/23 8469   Are there any labs that are clinically significant?  Yes []  OR No[]   Is the patient eligible to continue enrollment in the study after screening visit?  Yes []  OR No[]

## 2023-09-10 NOTE — Care Management (Signed)
Transition of Care [facility]) - Inpatient Brief Assessment   Patient Details  Name: Maxsim Bangerter MRN: [id number] Date of Birth: [birth year]  Transition of Care [facility] CM/SW Contact:    Lockie Pares, RN Phone Number: 09/10/2023, 1:10 PM   Clinical Narrative:  57 yo presented with MI. Cath lab with stent. Will need to be on medications. Currently without health insurance or PCP. Called patient to discuss due to remote. LM for patient and information on AVS for Healthcare.gov and CHW. For PCP. PLEASE SEND medications to [facility] pharmacy upon DC  Transition of Care Asessment: Insurance and Status: Selfpay Patient has primary care physician: No Home environment has been reviewed: HOme with spouse Prior level of function:: Independent Prior/Current Home Services: No current home services Social Drivers of Health Review: SDOH reviewed no interventions necessary Readmission risk has been reviewed: Yes Transition of care needs: transition of care needs identified, TOC will continue to follow

## 2023-09-10 NOTE — Progress Notes (Signed)
CSW received consult for patient. Patient gave permission to be screened for Medicaid. CSW reached out to [place] in Financial counseling, and request for patient to be screened for Medicaid. No further questions reported at this time.

## 2023-09-10 NOTE — Plan of Care (Signed)
  Problem: Education: Goal: Knowledge of General Education information will improve Description: Including pain rating scale, medication(s)/side effects and non-pharmacologic comfort measures Outcome: Progressing   Problem: Health Behavior/Discharge Planning: Goal: Ability to manage health-related needs will improve Outcome: Progressing   Problem: Clinical Measurements: Goal: Ability to maintain clinical measurements within normal limits will improve Outcome: Progressing Goal: Will remain free from infection Outcome: Progressing Goal: Diagnostic test results will improve Outcome: Progressing Goal: Respiratory complications will improve Outcome: Progressing Goal: Cardiovascular complication will be avoided Outcome: Progressing   Problem: Activity: Goal: Risk for activity intolerance will decrease Outcome: Progressing   Problem: Nutrition: Goal: Adequate nutrition will be maintained Outcome: Progressing   Problem: Coping: Goal: Level of anxiety will decrease Outcome: Progressing   Problem: Elimination: Goal: Will not experience complications related to bowel motility Outcome: Progressing Goal: Will not experience complications related to urinary retention Outcome: Progressing   Problem: Pain Management: Goal: General experience of comfort will improve Outcome: Progressing   Problem: Safety: Goal: Ability to remain free from injury will improve Outcome: Progressing   Problem: Skin Integrity: Goal: Risk for impaired skin integrity will decrease Outcome: Progressing   Problem: Education: Goal: Understanding of cardiac disease, CV risk reduction, and recovery process will improve Outcome: Progressing   Problem: Activity: Goal: Ability to tolerate increased activity will improve Outcome: Progressing   Problem: Cardiac: Goal: Ability to achieve and maintain adequate cardiovascular perfusion will improve Outcome: Progressing

## 2023-09-10 NOTE — Research (Signed)
Research PE exam   Patient seen an examined prior to randomization   Physical Exam: General appearance: alert and cooperative Lungs: wheezes bibasilar Heart: regular rate and rhythm, S1, S2 normal, no murmur, click, rub or gallop Extremities: extremities normal, atraumatic, no cyanosis or edema Skin: Skin color, texture, turgor normal. No rashes or lesions Neurologic: Alert and oriented X 3, normal strength and tone. Normal symmetric reflexes. Normal coordination and gait  Killip Class:  [x] 1 [] 2[] 3 [] 4  NYHA:  [x] 1 [] 2[] 3 [] 4  AMI Type 1?: [x] Yes [] No   The patient was given the opportunity to ask any further questions about the study that were not addressed by the research nurse coordinators - he has no further questions and wants to proceed at this time.  SignedLaverda Page, NP-C 09/10/2023, 2:35 PM Pager: [phone]

## 2023-09-10 NOTE — Progress Notes (Signed)
CARDIAC REHAB PHASE I   PRE:  Rate/Rhythm: 76 SR  BP:  Sitting: 109/87      SaO2: 98 RA  MODE:  Ambulation: 370 ft   POST:  Rate/Rhythm: 88 SR  BP:  Sitting: 101/74      SaO2: 96 RA  Pt ambulated in hallway, tolerated well with no CP, SOB or dizziness. Returned to chair with call bell and bedside table in reach.  Post MI/stent education including restrictions, risk factors, exercise guidelines, antiplatelet therapy importance, MI booklet, NTG use, heart healthy diet, smoking cessation and CRP2 reviewed. All questions and concerns addressed. Will refer to [facility] for CRP2. Will continue to follow.   [id number] Woodroe Chen, RN BSN 09/10/2023 12:29 PM

## 2023-09-11 ENCOUNTER — Telehealth (HOSPITAL_COMMUNITY): Payer: Self-pay

## 2023-09-11 ENCOUNTER — Other Ambulatory Visit (HOSPITAL_COMMUNITY): Payer: Self-pay

## 2023-09-11 ENCOUNTER — Encounter (HOSPITAL_COMMUNITY): Payer: Self-pay | Admitting: Cardiology

## 2023-09-11 ENCOUNTER — Telehealth: Payer: Self-pay | Admitting: Cardiology

## 2023-09-11 DIAGNOSIS — I10 Essential (primary) hypertension: Secondary | ICD-10-CM | POA: Insufficient documentation

## 2023-09-11 DIAGNOSIS — E785 Hyperlipidemia, unspecified: Secondary | ICD-10-CM | POA: Insufficient documentation

## 2023-09-11 LAB — BASIC METABOLIC PANEL
Anion gap: 8 (ref 5–15)
BUN: 16 mg/dL (ref 6–20)
CO2: 28 mmol/L (ref 22–32)
Calcium: 9.5 mg/dL (ref 8.9–10.3)
Chloride: 102 mmol/L (ref 98–111)
Creatinine, Ser: 1.13 mg/dL (ref 0.61–1.24)
GFR, Estimated: >60 mL/min (ref >60)
Glucose, Bld: 108 mg/dL — ABNORMAL HIGH (ref 70–99)
Potassium: 4.7 mmol/L (ref 3.5–5.1)
Sodium: 138 mmol/L (ref 135–145)

## 2023-09-11 LAB — MAGNESIUM: Magnesium: 1.9 mg/dL (ref 1.7–2.4)

## 2023-09-11 LAB — LIPOPROTEIN A (LPA): Lipoprotein (a): 41.2 nmol/L — ABNORMAL HIGH (ref <75.0)

## 2023-09-11 MED ORDER — TICAGRELOR 90 MG PO TABS
90.0000 mg | ORAL_TABLET | Freq: Two times a day (BID) | ORAL | 2 refills | Status: DC
  Filled 2023-09-11: qty 60, 30d supply, fill #0
  Filled 2023-10-05: qty 60, 30d supply, fill #1
  Filled 2023-11-04: qty 60, 30d supply, fill #2
  Filled 2023-12-03: qty 60, 30d supply, fill #3

## 2023-09-11 MED ORDER — MAGNESIUM SULFATE 2 GM/50ML IV SOLN
2.0000 g | Freq: Once | INTRAVENOUS | Status: AC
  Administered 2023-09-11: 2 g via INTRAVENOUS
  Filled 2023-09-11: qty 50

## 2023-09-11 MED ORDER — LOSARTAN POTASSIUM 25 MG PO TABS
25.0000 mg | ORAL_TABLET | Freq: Every day | ORAL | 1 refills | Status: DC
  Filled 2023-09-11: qty 30, 30d supply, fill #0
  Filled 2023-10-05: qty 30, 30d supply, fill #1
  Filled 2023-11-04: qty 30, 30d supply, fill #2
  Filled 2023-12-03: qty 30, 30d supply, fill #3

## 2023-09-11 MED ORDER — STUDY - ARTEMIS - ZILTIVEKIMAB 15 MG/0.5 ML OR PLACEBO SQ INJECTION (PI-CHRISTOPHER): 15.0000 mg | INJECTION | SUBCUTANEOUS | 0 refills | Status: DC

## 2023-09-11 MED ORDER — ASPIRIN 81 MG PO CHEW
81.0000 mg | CHEWABLE_TABLET | Freq: Every day | ORAL | 2 refills | Status: DC
  Filled 2023-09-11: qty 30, 30d supply, fill #0
  Filled 2023-10-05: qty 30, 30d supply, fill #1
  Filled 2023-11-04: qty 30, 30d supply, fill #2
  Filled 2023-12-03: qty 30, 30d supply, fill #3
  Filled 2023-12-31: qty 30, 30d supply, fill #4
  Filled 2024-01-30: qty 30, 30d supply, fill #5
  Filled 2024-03-03: qty 30, 30d supply, fill #0
  Filled 2024-03-29: qty 30, 30d supply, fill #1
  Filled 2024-04-29: qty 30, 30d supply, fill #2

## 2023-09-11 MED ORDER — NITROGLYCERIN 0.4 MG SL SUBL
0.4000 mg | SUBLINGUAL_TABLET | SUBLINGUAL | 2 refills | Status: DC | PRN
  Filled 2023-09-11: qty 25, 8d supply, fill #0

## 2023-09-11 MED ORDER — CARVEDILOL 12.5 MG PO TABS
12.5000 mg | ORAL_TABLET | Freq: Two times a day (BID) | ORAL | 1 refills | Status: DC
  Filled 2023-09-11: qty 60, 30d supply, fill #0

## 2023-09-11 MED ORDER — ROSUVASTATIN CALCIUM 40 MG PO TABS
40.0000 mg | ORAL_TABLET | Freq: Every day | ORAL | 1 refills | Status: DC
  Filled 2023-09-11: qty 30, 30d supply, fill #0

## 2023-09-11 NOTE — Plan of Care (Signed)
  Problem: Elimination: Goal: Will not experience complications related to bowel motility Outcome: Adequate for Discharge   Problem: Activity: Goal: Ability to tolerate increased activity will improve Outcome: Adequate for Discharge   Problem: Activity: Goal: Ability to return to baseline activity level will improve Outcome: Adequate for Discharge   Problem: Cardiovascular: Goal: Ability to achieve and maintain adequate cardiovascular perfusion will improve Outcome: Adequate for Discharge Goal: Vascular access site(s) Level 0-1 will be maintained Outcome: Adequate for Discharge

## 2023-09-11 NOTE — Telephone Encounter (Signed)
   Transition of Care Follow-up Phone Call Request    Patient Name: Colton White Date of Birth: 02/14/1966 Date of Encounter: 09/11/2023  Primary Care Provider:  Pcp, No Primary Cardiologist:  Peter [place], MD  [name] has been scheduled for a transition of care follow up appointment with a [facility] provider:  Joni Reining 12/31  Please reach out to [name] within 48 hours of discharge to confirm appointment and review transition of care protocol questionnaire. Anticipated discharge date: 12/20  Laverda Page, NP  09/11/2023, 8:44 AM

## 2023-09-11 NOTE — Telephone Encounter (Signed)
Heart Failure Patient Advocate Encounter  Medication assistance forms for Brilinta have been started. Patient has signed forms.  Provider information will need to be completed before submitting.  Forms have been attached to patient chart under 'Media' tab. Routing to appropriate office. Please reach out to me with any questions or concerns.  Burnell Blanks, CPhT Rx Patient Advocate Phone: [phone]

## 2023-09-11 NOTE — Care Management (Signed)
1026 09-11-23 MATCH completed for this patient. Medications will be delivered via [facility].   MATCH MEDICATION ASSISTANCE CARD Pharmacies please call [phone] for claim processing assistance.  Rx BIN: [id number] Rx Group: [id number] Rx PCN: [id number] Relationship Code: 1 Person Code: [id number]  Patient ID (MRN): [id number]    Patient Name:  Colton White   Patient DOB: 1966/04/07   Discharge Date: 09-11-23   Expiration Date: 09-19-23 (must be filled within 7 days of discharge)    Case Manager called to the [facility] and appointment scheduled for Jan 8 @ 9:30 for PCP needs. Spouse is at the bedside for transport home. No further needs identified at this time.

## 2023-09-11 NOTE — Discharge Summary (Addendum)
Discharge Summary    Patient ID: Colton White MRN: [id number]; DOB: [birth year]  Admit date: 09/09/2023 Discharge date: 09/11/2023  PCP:  Pcp, No   [facility] Providers Cardiologist:  Peter [place], MD      Discharge Diagnoses    Principal Problem:   ST elevation myocardial infarction involving left circumflex coronary artery [facility]) Active Problems:   STEMI involving left circumflex coronary artery (HCC)   Hyperlipidemia   Hypertension   Diagnostic Studies/Procedures    Cath: 09/09/2023  Critical 2 vessel obstructive CAD. Moderately elevated LVEDP 26 mm Hg Successful PCI of the mid and crux RCA tandem lesions with DES x 1 Successful PCI of the mid LCx with DES   Plan: check Echo. DAPT for one year. Candidate for DC in am if stable. Smoking cessation. Optimize BP and lipid control   Diagnostic Dominance: Right  Intervention    Echo: 09/10/2023  IMPRESSIONS     1. Left ventricular ejection fraction, by estimation, is 60 to 65%. The  left ventricle has normal function. The left ventricle has no regional  wall motion abnormalities. Left ventricular diastolic parameters are  consistent with Grade I diastolic  dysfunction (impaired relaxation). The average left ventricular global  longitudinal strain is -9.0 %. The global longitudinal strain is abnormal.   2. Right ventricular systolic function is normal. The right ventricular  size is normal. Tricuspid regurgitation signal is inadequate for assessing  PA pressure.   3. The mitral valve is normal in structure. Moderate mitral valve  regurgitation.   4. The aortic valve is tricuspid. Aortic valve regurgitation is not  visualized.   5. The inferior vena cava is normal in size with greater than 50%  respiratory variability, suggesting right atrial pressure of 3 mmHg.   FINDINGS   Left Ventricle: Left ventricular ejection fraction, by estimation, is 60  to 65%. The left ventricle has normal function.  The left ventricle has no  regional wall motion abnormalities. The average left ventricular global  longitudinal strain is -9.0 %. The   global longitudinal strain is abnormal. The left ventricular internal  cavity size was normal in size. There is no left ventricular hypertrophy.  Left ventricular diastolic parameters are consistent with Grade I  diastolic dysfunction (impaired  relaxation).   Right Ventricle: The right ventricular size is normal. Right ventricular  systolic function is normal. Tricuspid regurgitation signal is inadequate  for assessing PA pressure.   Left Atrium: Left atrial size was normal in size.   Right Atrium: Right atrial size was normal in size.   Pericardium: There is no evidence of pericardial effusion.   Mitral Valve: The mitral valve is normal in structure. Moderate mitral  valve regurgitation.   Tricuspid Valve: Tricuspid valve regurgitation is not demonstrated.   Aortic Valve: The aortic valve is tricuspid. Aortic valve regurgitation is  not visualized. Aortic valve mean gradient measures 2.0 mmHg. Aortic valve  peak gradient measures 3.6 mmHg. Aortic valve area, by VTI measures 3.05  cm.   Pulmonic Valve: Pulmonic valve regurgitation is not visualized.   Aorta: The aortic root is normal in size and structure.   Venous: The inferior vena cava is normal in size with greater than 50%  respiratory variability, suggesting right atrial pressure of 3 mmHg.   IAS/Shunts: No atrial level shunt detected by color flow Doppler.   _____________   History of Present Illness     Colton White is a 57 y.o. male with PMH of remote DVT who  presented at a STEMI. Reported he has never seen a cardiologist in the past. Has been having intermittent episodes of chest pain for the past week. Reported typically resolved relatively quickly. Chest pain returned the morning of admission and [facility]. EKG there showed ST elevation in inferior leads with  reciprocal changes in anterior leads. CODE STEMI called. Given 324 ASA and IV heparin prior to transport. He was taken directly to the cath lab on arrival his chest pain had resolved.   Hospital Course    STEMI -- underwent cardiac cath noted above with successful PCI of m/crux RCA with DES x1, along with DES x1 to mLCx. Placed on DAPT with ASA/Brilinta for at least one year. Worked well with cardiac rehab without recurrent chest pain. Echo with LVEF of 60-65%, g1DD, normal RV, moderate MR -- continue ASA, Brilinta, coreg 12.5mg  BID, losartan 25mg  daily, Crestor 40mg  daily  NSVT -- episode while in the ICU, electrolytes stable -- continue coreg 12.5mg  BID   HTN -- continue coreg 12.5mg  BID, losartan to 25mg    HLD -- LDL 96, HDL 45 -- Cont high dose statin -- will need FLP/LFTs in 8 weeks   General: Well developed, well nourished, male appearing in no acute distress. Head: Normocephalic, atraumatic.  Neck: Supple without bruits, JVD. Lungs:  Resp regular and unlabored, CTA. Heart: RRR, S1, S2, no S3, S4, or murmur; no rub. Abdomen: Soft, non-tender, non-distended with normoactive bowel sounds. No hepatomegaly. No rebound/guarding. No obvious abdominal masses. Extremities: No clubbing, cyanosis, edema. Distal pedal pulses are 2+ bilaterally. Right radial cath site stable without bruising or hematoma Neuro: Alert and oriented X 3. Moves all extremities spontaneously. Psych: Normal affect.  Seen by Dr. Lynnette Caffey and deemed stable for discharge home. Follow up arranged in the office. Medications sent to the [facility] pharmacy. Educated by pharmD prior to discharge.  Did the patient have an acute coronary syndrome (MI, NSTEMI, STEMI, etc) this admission?:  Yes                               AHA/ACC ACS Clinical Performance & Quality Measures: Aspirin prescribed? - Yes ADP Receptor Inhibitor (Plavix/Clopidogrel, Brilinta/Ticagrelor or Effient/Prasugrel) prescribed (includes medically managed  patients)? - Yes Beta Blocker prescribed? - Yes High Intensity Statin (Lipitor 40-80mg  or Crestor 20-40mg ) prescribed? - Yes EF assessed during THIS hospitalization? - Yes For EF <40%, was ACEI/ARB prescribed? - Not Applicable (EF >/= 40%) For EF <40%, Aldosterone Antagonist (Spironolactone or Eplerenone) prescribed? - Not Applicable (EF >/= 40%) Cardiac Rehab Phase II ordered (including medically managed patients)? - Yes       The patient will be scheduled for a TOC follow up appointment in 10-14 days.  A message has been sent to the [facility] and Scheduling Pool at the office where the patient should be seen for follow up.  _____________  Discharge Vitals Blood pressure 111/79, pulse 71, temperature 98 F (36.7 C), temperature source Oral, resp. rate 18, height 5\' 8"  (1.727 m), weight 60.7 kg, SpO2 98%.  Filed Weights   09/09/23 0942 09/10/23 1641  Weight: 62.5 kg 60.7 kg    Labs & Radiologic Studies    CBC Recent Labs    09/09/23 1317 09/10/23 0229  WBC 20.5* 19.4*  18.9*  NEUTROABS  --  14.8*  HGB 17.0 16.9  17.0  HCT 48.4 49.8  49.6  MCV 83.4 85.1  84.5  PLT 332 328  323   Basic Metabolic Panel Recent Labs    83/15/17 0229 09/10/23 0840 09/11/23 0437  NA 137  --  138  K 4.0  --  4.7  CL 104  --  102  CO2 22  --  28  GLUCOSE 124*  --  108*  BUN 13  --  16  CREATININE 1.14  --  1.13  CALCIUM 9.9  --  9.5  MG  --  1.9 1.9   Liver Function Tests Recent Labs    09/09/23 0953  AST 25  ALT 19  ALKPHOS 64  BILITOT 0.8  PROT 7.5  ALBUMIN 4.3   No results for input(s): "LIPASE", "AMYLASE" in the last 72 hours. High Sensitivity Troponin:   Recent Labs  Lab 09/09/23 0948 09/09/23 1317 09/09/23 1652  TROPONINIHS 84* 3,766* 7,894*    BNP Invalid input(s): "POCBNP" D-Dimer No results for input(s): "DDIMER" in the last 72 hours. Hemoglobin A1C Recent Labs    09/09/23 1317  HGBA1C 5.9*   Fasting Lipid Panel Recent Labs    09/10/23 0229   CHOL 143  HDL 46  LDLCALC 85  TRIG 60  CHOLHDL 3.1   Thyroid Function Tests No results for input(s): "TSH", "T4TOTAL", "T3FREE", "THYROIDAB" in the last 72 hours.  Invalid input(s): "FREET3" _____________  ECHOCARDIOGRAM COMPLETE Result Date: 09/10/2023    ECHOCARDIOGRAM REPORT   Patient Name:   Colton White Date of Exam: 09/10/2023 Medical Rec #:  [id number]   Height:       68.0 in Accession #:    [id number]  Weight:       137.8 lb Date of Birth:  11/19/1965   BSA:          1.744 m Patient Age:    57 years    BP:           100/79 mmHg Patient Gender: M           HR:           70 bpm. Exam Location:  Inpatient Procedure: 2D Echo, Cardiac Doppler, Color Doppler, 3D Echo and Strain Analysis Indications:    Acute myocardial infarction I21.9  History:        Patient has no prior history of Echocardiogram examinations.                 Acute MI; Risk Factors:Hypertension, Dyslipidemia and Current                 Smoker. Successful PCI RCA and LCx-09/09/23.  Sonographer:    Dondra Prader RVT RCS Referring Phys: [phone] PETER M [place] IMPRESSIONS  1. Left ventricular ejection fraction, by estimation, is 60 to 65%. The left ventricle has normal function. The left ventricle has no regional wall motion abnormalities. Left ventricular diastolic parameters are consistent with Grade I diastolic dysfunction (impaired relaxation). The average left ventricular global longitudinal strain is -9.0 %. The global longitudinal strain is abnormal.  2. Right ventricular systolic function is normal. The right ventricular size is normal. Tricuspid regurgitation signal is inadequate for assessing PA pressure.  3. The mitral valve is normal in structure. Moderate mitral valve regurgitation.  4. The aortic valve is tricuspid. Aortic valve regurgitation is not visualized.  5. The inferior vena cava is normal in size with greater than 50% respiratory variability, suggesting right atrial pressure of 3 mmHg. FINDINGS  Left Ventricle: Left  ventricular ejection fraction, by estimation, is 60 to 65%. The left ventricle has normal function. The left  ventricle has no regional wall motion abnormalities. The average left ventricular global longitudinal strain is -9.0 %. The  global longitudinal strain is abnormal. The left ventricular internal cavity size was normal in size. There is no left ventricular hypertrophy. Left ventricular diastolic parameters are consistent with Grade I diastolic dysfunction (impaired relaxation). Right Ventricle: The right ventricular size is normal. Right ventricular systolic function is normal. Tricuspid regurgitation signal is inadequate for assessing PA pressure. Left Atrium: Left atrial size was normal in size. Right Atrium: Right atrial size was normal in size. Pericardium: There is no evidence of pericardial effusion. Mitral Valve: The mitral valve is normal in structure. Moderate mitral valve regurgitation. Tricuspid Valve: Tricuspid valve regurgitation is not demonstrated. Aortic Valve: The aortic valve is tricuspid. Aortic valve regurgitation is not visualized. Aortic valve mean gradient measures 2.0 mmHg. Aortic valve peak gradient measures 3.6 mmHg. Aortic valve area, by VTI measures 3.05 cm. Pulmonic Valve: Pulmonic valve regurgitation is not visualized. Aorta: The aortic root is normal in size and structure. Venous: The inferior vena cava is normal in size with greater than 50% respiratory variability, suggesting right atrial pressure of 3 mmHg. IAS/Shunts: No atrial level shunt detected by color flow Doppler.  LEFT VENTRICLE PLAX 2D LVIDd:         4.40 cm   Diastology LVIDs:         2.80 cm   LV e' medial:    5.56 cm/s LV PW:         1.00 cm   LV E/e' medial:  6.6 LV IVS:        0.80 cm   LV e' lateral:   5.55 cm/s LVOT diam:     2.00 cm   LV E/e' lateral: 6.6 LV SV:         42 LV SV Index:   24        2D Longitudinal Strain LVOT Area:     3.14 cm  2D Strain GLS Avg:     -9.0 %  RIGHT VENTRICLE             IVC  RV Basal diam:  3.20 cm     IVC diam: 1.00 cm RV S prime:     10.10 cm/s TAPSE (M-mode): 1.2 cm LEFT ATRIUM           Index        RIGHT ATRIUM           Index LA Vol (A4C): 19.3 ml 11.06 ml/m  RA Area:     10.10 cm                                    RA Volume:   16.90 ml  9.69 ml/m  AORTIC VALVE                    PULMONIC VALVE AV Area (Vmax):    2.35 cm     PV Vmax:       0.59 m/s AV Area (Vmean):   2.18 cm     PV Peak grad:  1.4 mmHg AV Area (VTI):     3.05 cm AV Vmax:           95.00 cm/s AV Vmean:          61.600 cm/s AV VTI:            0.139 m AV Peak  Grad:      3.6 mmHg AV Mean Grad:      2.0 mmHg LVOT Vmax:         71.00 cm/s LVOT Vmean:        42.700 cm/s LVOT VTI:          0.135 m LVOT/AV VTI ratio: 0.97  AORTA Ao Root diam: 3.20 cm MITRAL VALVE MV Area (PHT): 2.56 cm    SHUNTS MV Decel Time: 296 msec    Systemic VTI:  0.14 m MV E velocity: 36.90 cm/s  Systemic Diam: 2.00 cm MV A velocity: 57.90 cm/s MV E/A ratio:  0.64 [place] signed by Carolan Clines Signature Date/Time: 09/10/2023/12:01:33 PM    Final    CARDIAC CATHETERIZATION Result Date: 09/09/2023 Critical 2 vessel obstructive CAD. Moderately elevated LVEDP 26 mm Hg Successful PCI of the mid and crux RCA tandem lesions with DES x 1 Successful PCI of the mid LCx with DES Plan: check Echo. DAPT for one year. Candidate for DC in am if stable. Smoking cessation. Optimize BP and lipid control   DG Chest Portable 1 View Result Date: 09/09/2023 CLINICAL DATA:  Chest pain. EXAM: PORTABLE CHEST 1 VIEW COMPARISON:  02/09/2018. FINDINGS: Bilateral lung fields are clear. Bilateral costophrenic angles are clear. Normal cardio-mediastinal silhouette. No acute osseous abnormalities. The soft tissues are within normal limits. IMPRESSION: No active disease. Electronically Signed   By: Jules Schick M.D.   On: 09/09/2023 11:13   Disposition   Pt is being discharged home today in good condition.  Follow-up Plans & Appointments      Follow-up Information     [email or web address]. Call.   Contact information: Website to gain insurance  for next year, closes at the end of month        [facility] Follow up.   Why: Make an appointment to obtain a primary care physician. they have finacial counselling and a pharmacy Contact information: [address] [place] [address] [phone]        [place] medicaid. Call.   Contact information: [email or web address]  [phone]               Discharge Instructions     Amb Referral to Cardiac Rehabilitation   Complete by: As directed    Diagnosis:  STEMI Coronary Stents     After initial evaluation and assessments completed: Virtual Based Care may be provided alone or in conjunction with Phase 2 Cardiac Rehab based on patient barriers.: Yes   Intensive Cardiac Rehabilitation (ICR) [facility] location only OR Traditional Cardiac Rehabilitation (TCR) *If criteria for ICR are not met will enroll in TCR [facility] only): Yes   Call MD for:  difficulty breathing, headache or visual disturbances   Complete by: As directed    Call MD for:  persistant dizziness or light-headedness   Complete by: As directed    Call MD for:  redness, tenderness, or signs of infection (pain, swelling, redness, odor or green/yellow discharge around incision site)   Complete by: As directed    Diet - low sodium heart healthy   Complete by: As directed    Discharge instructions   Complete by: As directed    Radial Site Care Refer to this sheet in the next few weeks. These instructions provide you with information on caring for yourself after your procedure. Your caregiver may also give you more specific instructions. Your treatment has been planned according to current medical  practices, but problems sometimes occur. Call your caregiver if you have any problems or questions after your procedure. HOME CARE INSTRUCTIONS You may  shower the day after the procedure. Remove the bandage (dressing) and gently wash the site with plain soap and water. Gently pat the site dry.  Do not apply powder or lotion to the site.  Do not submerge the affected site in water for 3 to 5 days.  Inspect the site at least twice daily.  Do not flex or bend the affected arm for 24 hours.  No lifting over 5 pounds (2.3 kg) for 5 days after your procedure.  Do not drive home if you are discharged the same day of the procedure. Have someone else drive you.  You may drive 24 hours after the procedure unless otherwise instructed by your caregiver.  What to expect: Any bruising will usually fade within 1 to 2 weeks.  Blood that collects in the tissue (hematoma) may be painful to the touch. It should usually decrease in size and tenderness within 1 to 2 weeks.  SEEK IMMEDIATE MEDICAL CARE IF: You have unusual pain at the radial site.  You have redness, warmth, swelling, or pain at the radial site.  You have drainage (other than a small amount of blood on the dressing).  You have chills.  You have a fever or persistent symptoms for more than 72 hours.  You have a fever and your symptoms suddenly get worse.  Your arm becomes pale, cool, tingly, or numb.  You have heavy bleeding from the site. Hold pressure on the site.   PLEASE DO NOT MISS ANY DOSES OF YOUR BRILINTA!!!!! Also keep a log of you blood pressures and bring back to your follow up appt. Please call the office with any questions.   Patients taking blood thinners should generally stay away from medicines like ibuprofen, Advil, Motrin, naproxen, and Aleve due to risk of stomach bleeding. You may take Tylenol as directed or talk to your primary doctor about alternatives.  PLEASE ENSURE THAT YOU DO NOT RUN OUT OF YOUR BRILINTA. This medication is very important to remain on for at least one year. IF you have issues obtaining this medication due to cost please CALL the office 3-5 business days  prior to running out in order to prevent missing doses of this medication.   Increase activity slowly   Complete by: As directed         Discharge Medications   Allergies as of 09/11/2023   No Known Allergies      Medication List     TAKE these medications    ARTEMIS ziltivekimab or placebo 15 mg/0.5 mL SQ injection Inject 0.5 mLs (15 mg total) into the skin every 28 (twenty-eight) days. Investigational Study Drug. Please contact the [facility] team for any questions or concerns regarding this medication.   aspirin 81 MG chewable tablet Chew 1 tablet (81 mg total) by mouth daily.   carvedilol 12.5 MG tablet Commonly known as: COREG Take 1 tablet (12.5 mg total) by mouth 2 (two) times daily with a meal.   diclofenac sodium 1 % Gel Commonly known as: VOLTAREN Apply 4 g topically 4 (four) times daily.   lidocaine 5 % Commonly known as: Lidoderm Place 1 patch onto the skin daily. Remove & Discard patch within 12 hours or as directed by MD   losartan 25 MG tablet Commonly known as: COZAAR Take 1 tablet (25 mg total) by mouth daily.   nitroGLYCERIN  0.4 MG SL tablet Commonly known as: NITROSTAT Place 1 tablet (0.4 mg total) under the tongue every 5 (five) minutes x 3 doses as needed for chest pain.   rosuvastatin 40 MG tablet Commonly known as: CRESTOR Take 1 tablet (40 mg total) by mouth daily.   ticagrelor 90 MG Tabs tablet Commonly known as: BRILINTA Take 1 tablet (90 mg total) by mouth 2 (two) times daily.   traMADol 50 MG tablet Commonly known as: ULTRAM Take 1 tablet (50 mg total) by mouth every 6 (six) hours as needed.           Outstanding Labs/Studies   FLP/LFTs in 8 weeks   Duration of Discharge Encounter   Greater than 30 minutes including physician time.  Signed, Laverda Page, NP 09/11/2023, 9:54 AM  ATTENDING ATTESTATION:  After conducting a review of all available clinical information with the care team, interviewing the  patient, and performing a physical exam, I agree with the findings and plan described in this note.   See my exam and recommendations in separate progress note from today.   Alverda Skeans, MD Pager [phone]

## 2023-09-11 NOTE — Progress Notes (Signed)
CARDIAC REHAB PHASE I   Pt resting in bed, feeling well today. Post MI/stent education completed. Referral for CRP2 sent to [facility]. Plan for discharge home today.  [id number] Woodroe Chen, RN BSN 09/11/2023 10:19 AM

## 2023-09-11 NOTE — Progress Notes (Signed)
   Patient Name: Colton White Date of Encounter: 09/11/2023 [facility] Cardiologist: [place]  Interval Summary  .    Transferred out of unit yesterday.  Echo yesterday normal LV function, no valve issues.  Tele unremarkable  Feels well.  Ready to go home.   Vital Signs .    Vitals:   09/10/23 2333 09/11/23 0100 09/11/23 0500 09/11/23 0753  BP: 92/62 105/70 116/79 111/79  Pulse: 70 67 71   Resp: 20 18 20 18   Temp: 98.4 F (36.9 C)  98.6 F (37 C) 98 F (36.7 C)  TempSrc: Oral  Oral Oral  SpO2: 97% 98% 98%   Weight:      Height:        Intake/Output Summary (Last 24 hours) at 09/11/2023 0938 Last data filed at 09/10/2023 1400 Gross per 24 hour  Intake 250 ml  Output 700 ml  Net -450 ml      09/10/2023    4:41 PM 09/09/2023    9:42 AM 02/09/2018    5:58 PM  Last 3 Weights  Weight (lbs) 133 lb 13.1 oz 137 lb 12.8 oz 160 lb  Weight (kg) 60.7 kg 62.506 kg 72.576 kg      Telemetry/ECG     NSR - Personally Reviewed  Physical Exam .   GEN: No acute distress.   Neck: No JVD Cardiac: RRR, no murmurs, rubs, or gallops.  Respiratory: Clear to auscultation bilaterally. GI: Soft, nontender, non-distended  MS: No edema  Assessment & Plan .     STEMI:  S/P DES x 2 RCA, x 1 Lcx.  Cont DAPT, high dose atorvastatin, Coreg 12.5mg  bid, PRN NTG, losartan.  TTE with preserved function. NSVT:  Resolved, cont coreg 12.5mg  bid.   HTN:  BP well controlled HL:  Cont high dose statin Dispo:  D/C today with close hospital follow up   For questions or updates, please contact [facility] Please consult [email or web address] for contact info under        Signed, Orbie Pyo, MD

## 2023-09-11 NOTE — Progress Notes (Addendum)
Discharge instructions reviewed with pt and his wife.  Copy of instructions given to pt/wife, [facility] has filled his scripts and will be picked up on the way out for discharge.   Secure chat message sent to Laverda Page NP to inquire for pt/wife of where will the pt get the next placebo medication injection, wife understood medication would be filled by [facility] pharmacy and they would inject the medication themselves. Pt has an appointment at [facility] on January 16 and should get his next injection at that appointment.   Pt to be d/c'd via wheelchair with belongings, with his wife and will be            escorted by hospital volunteer/staff.    Annice Needy, RN SWOT

## 2023-09-11 NOTE — Telephone Encounter (Signed)
Left voicemail to return call to office.

## 2023-09-14 NOTE — Telephone Encounter (Signed)
2nd attempt to call patient, no answer mailbox full, unable to leave voicemail.

## 2023-09-15 NOTE — Telephone Encounter (Signed)
Patient identification verified by 2 forms. Colton Rail, RN    Patient contacted regarding discharge from [facility] on 09/11/23.  Patient understands to follow up with provider NP Lyman Bishop on 12/31 at 10:55am at [facility]. Patient understands discharge instructions? Yes Patient understands medications and regiment? Yes Patient understands to bring all medications to this visit? Yes  Patient's wife states:   -Patient has complaint of twinge/shock sensation on left side of chest   -sensation lasts a few seconds   -"it is a weird feeling"  -patient is a bit anxious, not sleeping much Patient's wife denies:   -chest pain   -SOB/difficulty  Advised to continue monitor symptoms at home, follow up with PCP  Reviewed ED warning signs/precautions  Patients wife has no further questions at this time

## 2023-09-18 NOTE — Research (Cosign Needed)
Are there any labs that are clinically significant?  Yes []  OR No[]  ? ?Is the patient eligible to continue enrollment in the study after screening visit?  ?Yes []   OR No[]   ? ? ? ? ? ? ? ? ?

## 2023-09-20 NOTE — Progress Notes (Signed)
 Cardiology Office Note:  .   Date:  09/22/2023  ID:  Marcey Oliva Dunnings, DOB [birth year], MRN [id number] PCP: Freddrick Johns  [facility] Providers Cardiologist:  Peter Jordan, MD }   History of Present Illness: .   Elick Aguilera is a 57 y.o. male with history of STEMI on 09/10/2023, NSVT, remote history of DVT, hypertension, hyperlipidemia, we are seeing post hospitalization.  Underwent cardiac catheterization on 09/09/2023 with two-vessel critical obstructive CAD status post successful PCI of the mid and proximal RCA tandem lesions and drug-eluting stent x 1 with successful PCI of the mid left circumflex with drug-eluting stent.  Patient had echocardiogram on 09/10/2023 which found normal LVEF of 60 to 65% with grade 1 diastolic dysfunction.    On discharge the patient was placed on Brilinta , aspirin , carvedilol  12.5 mg twice daily, losartan  25 mg daily, and Crestor  40 mg daily.  LDL was 96 HDL 45.  Follow-up lipids and LFTs in 8 weeks are required.  He and his wife come today with multiple questions about his hospitalization, cardiac catheterization results and prognosis.  They also are uncertain about getting their medications refilled.  He did have a representative come and fill out paperwork to allow him to get his Brilinta  at a lower cost and they are still waiting on approval.  His main complaint is not being able to sleep.  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  Since being home from the hospital he has not been able to sleep more than 2 to 3 hours at a time.  He has not having any chest pain or shortness of breath.  He walks his dog and has no symptoms.  He denies any bleeding on Brilinta .  He wants to become more active.  He would like to be a part of cardiac rehab.  He quit smoking while he was in the hospital and has not returned to this habit.  He has lost about 6 pounds since returning home from the hospital and he is  worried about this weight loss.  He has been eating more healthy which probably attributes to this.  ROS: As above otherwise negative.  Studies Reviewed: .   Cath: 09/09/2023   Critical 2 vessel obstructive CAD. Moderately elevated LVEDP 26 mm Hg Successful PCI of the mid and crux RCA tandem lesions with DES x 1 Successful PCI of the mid LCx with DES   Plan: check Echo. DAPT for one year. Candidate for DC in am if stable. Smoking cessation. Optimize BP and lipid control    Diagnostic Dominance: Right  Intervention       EKG Interpretation Date/Time:  Tuesday September 22 2023 10:57:02 EST Ventricular Rate:  69 PR Interval:  126 QRS Duration:  88 QT Interval:  392 QTC Calculation: 420 R Axis:   79  Text Interpretation: Normal sinus rhythm Possible Left atrial enlargement T wave abnormality, consider inferior ischemia When compared with ECG of 10-Sep-2023 06:52, Inverted T waves have replaced nonspecific T wave abnormality in Inferior leads T wave amplitude has increased in Lateral leads Confirmed by Jerilynn Collar [phone]) on 09/22/2023 11:38:10 AM    Physical Exam:   VS:  BP 122/80 (BP Location: Left Arm, Patient Position: Sitting)   Pulse 69   Ht 5' 8 (1.727 m)   Wt 136 lb 6.4 oz (61.9 kg)   SpO2 97%   BMI 20.74 kg/m  Wt Readings from Last 3 Encounters:  09/22/23 136 lb 6.4 oz (61.9 kg)  09/10/23 133 lb 13.1 oz (60.7 kg)  02/09/18 160 lb (72.6 kg)    GEN: Well nourished, well developed in no acute distress NECK: No JVD; No carotid bruits CARDIAC: RRR, no murmurs, rubs, gallops RESPIRATORY:  Clear to auscultation without rales, wheezing or rhonchi  ABDOMEN: Soft, non-tender, non-distended EXTREMITIES:  No edema; No deformity right wrist catheter insertion site is well-healed without evidence of ecchymosis or infection.  ASSESSMENT AND PLAN: .    CAD: Status post STEMI with drug-eluting stent x 2 to the right coronary artery, and DES to the left circumflex.   LAD was with out stenosis.  He remains on dual antiplatelet therapy with aspirin  and Brilinta .  They have applied for drug assistance for the Brilinta .  He is having trouble sleeping since discharge.  I have advised him to speak with his primary care physician if this is something that becomes more pronounced to assess for assistance with this temporarily.  I believe is related to him being out of his routine of work life and home life.  He is also not been very active as he is recovering from his MI.           Multiple questions have been answered with explanations given on his cardiac catheterization to include a copy of the cath report with illustrations.  He is aware that he will need to be on DAPT for a minimum of 1 year.  He is to report any bleeding or excessive bruising, hemoptysis or hematuria.         He may begin cardiac rehab as soon as a time slot is available at the 6-week mark.  He should continue walking daily.  He is to avoid lifting very heavy objects.  He hide allowed him to help his son paint a home without having to carry heavy paint containers.  He verbalizes understanding.   2.  Hypercholesterolemia: The patient is now on atorvastatin 40 mg daily.  He will have fasting lipids and LFTs drawn in 2 months.  He will follow-up with Dr. Jordan thereafter.  Goal of LDL less than 70.  Labs on 09/10/2023 total cholesterol 143 HDL 46 LDL 85 LP(a) 41.2.  3.  Hypertension: He will continue losartan  25 mg daily and carvedilol  12.5 mg twice daily.  Blood pressure today is very well-controlled.  Follow-up labs in 2 months.  Patient is a steady candidate for ARTIEMIS (ziltivekimab or placebo).    The patient required a good bit of teaching concerning his medications, his activity, and it was also given information on drug assistant forms, insurance forms and local pharmacies for his medications.  We spent about 50 minutes with this patient during the posthospitalization  follow-up.   Signed, Lamarr HERO. Jerilynn CHOL, ANP, AACC

## 2023-09-22 ENCOUNTER — Encounter: Payer: Self-pay | Admitting: Adult Health

## 2023-09-22 ENCOUNTER — Telehealth (HOSPITAL_COMMUNITY): Payer: Self-pay

## 2023-09-22 ENCOUNTER — Ambulatory Visit: Payer: Medicaid Other | Attending: Adult Health | Admitting: Adult Health

## 2023-09-22 ENCOUNTER — Other Ambulatory Visit (HOSPITAL_BASED_OUTPATIENT_CLINIC_OR_DEPARTMENT_OTHER): Payer: Self-pay

## 2023-09-22 VITALS — BP 122/80 | HR 69 | Ht 68.0 in | Wt 136.4 lb

## 2023-09-22 DIAGNOSIS — Z006 Encounter for examination for normal comparison and control in clinical research program: Secondary | ICD-10-CM

## 2023-09-22 DIAGNOSIS — I2121 ST elevation (STEMI) myocardial infarction involving left circumflex coronary artery: Secondary | ICD-10-CM

## 2023-09-22 DIAGNOSIS — E7800 Pure hypercholesterolemia, unspecified: Secondary | ICD-10-CM | POA: Diagnosis not present

## 2023-09-22 DIAGNOSIS — I10 Essential (primary) hypertension: Secondary | ICD-10-CM | POA: Diagnosis not present

## 2023-09-22 DIAGNOSIS — F17201 Nicotine dependence, unspecified, in remission: Secondary | ICD-10-CM

## 2023-09-22 DIAGNOSIS — I2510 Atherosclerotic heart disease of native coronary artery without angina pectoris: Secondary | ICD-10-CM

## 2023-09-22 MED ORDER — ROSUVASTATIN CALCIUM 40 MG PO TABS
40.0000 mg | ORAL_TABLET | Freq: Every day | ORAL | 3 refills | Status: DC
  Filled 2023-09-22 – 2023-10-05 (×2): qty 30, 30d supply, fill #0
  Filled 2023-11-04: qty 30, 30d supply, fill #1
  Filled 2023-12-03: qty 30, 30d supply, fill #2

## 2023-09-22 MED ORDER — NITROGLYCERIN 0.4 MG SL SUBL: 0.4000 mg | SUBLINGUAL_TABLET | SUBLINGUAL | 2 refills | Status: DC | PRN

## 2023-09-22 MED ORDER — ROSUVASTATIN CALCIUM 40 MG PO TABS: 40.0000 mg | ORAL_TABLET | Freq: Every day | ORAL | 3 refills | Status: DC

## 2023-09-22 MED ORDER — CARVEDILOL 12.5 MG PO TABS: 12.5000 mg | ORAL_TABLET | Freq: Two times a day (BID) | ORAL | 3 refills | Status: DC

## 2023-09-22 MED ORDER — CARVEDILOL 12.5 MG PO TABS
12.5000 mg | ORAL_TABLET | Freq: Two times a day (BID) | ORAL | 3 refills | Status: DC
  Filled 2023-09-22 – 2023-10-05 (×2): qty 60, 30d supply, fill #0
  Filled 2023-11-04: qty 60, 30d supply, fill #1
  Filled 2023-12-03: qty 60, 30d supply, fill #2

## 2023-09-22 MED ORDER — NITROGLYCERIN 0.4 MG SL SUBL
0.4000 mg | SUBLINGUAL_TABLET | SUBLINGUAL | 2 refills | Status: DC | PRN
  Filled 2023-09-22: qty 25, 5d supply, fill #0

## 2023-09-22 NOTE — Addendum Note (Signed)
 Addended by: Myna Hidalgo A on: 09/22/2023 01:17 PM   Modules accepted: Orders

## 2023-09-22 NOTE — Telephone Encounter (Signed)
 Phase II referral for Cardiac Rehab faxed to [facility]

## 2023-09-22 NOTE — Patient Instructions (Signed)
Medication Instructions:  No Changes *If you need a refill on your cardiac medications before your next appointment, please call your pharmacy*   Lab Work: No Labs If you have labs (blood work) drawn today and your tests are completely normal, you will receive your results only by: MyChart Message (if you have MyChart) OR A paper copy in the mail If you have any lab test that is abnormal or we need to change your treatment, we will call you to review the results.   Testing/Procedures: No Testing   Follow-Up: At [facility], you and your health needs are our priority.  As part of our continuing mission to provide you with exceptional heart care, we have created designated Provider Care Teams.  These Care Teams include your primary Cardiologist (physician) and Advanced Practice Providers (APPs -  Physician Assistants and Nurse Practitioners) who all work together to provide you with the care you need, when you need it.  We recommend signing up for the patient portal called "MyChart".  Sign up information is provided on this After Visit Summary.  MyChart is used to connect with patients for Virtual Visits (Telemedicine).  Patients are able to view lab/test results, encounter notes, upcoming appointments, etc.  Non-urgent messages can be sent to your provider as well.   To learn more about what you can do with MyChart, go to [email or web address].    Your next appointment:   3 month(s)  Provider:   Peter Jordan, MD     

## 2023-09-24 ENCOUNTER — Telehealth: Payer: Self-pay | Admitting: Licensed Clinical Social Worker

## 2023-09-24 NOTE — Telephone Encounter (Signed)
 H&V Care Navigation CSW Progress Note  Clinical Social Worker contacted patient by phone to f/u after appy with Colton White at [employer]. Pt currently uninsured, no PCP. Note that [place] and [place] had been routed to provider. Per [place]an, CMA, it was completed and faxed into assistance program.  Patient is participating in a Managed Medicaid Plan:  No, self pay only  SDOH Screenings   Food Insecurity: No Food Insecurity (09/09/2023)  Housing: Low Risk  (09/09/2023)  Transportation Needs: No Transportation Needs (09/09/2023)  Utilities: Not At Risk (09/09/2023)  Tobacco Use: High Risk (09/22/2023)    Colton White, MSW, LCSW Clinical Social Worker II [facility] Heart/Vascular Care Navigation  [phone]- work cell phone (preferred) [phone]- desk phone

## 2023-09-24 NOTE — Progress Notes (Signed)
 Heart and Vascular Care Navigation  09/24/2023  Colton White 28-Aug-1966 [id number]  Reason for Referral: uninsured, no PCP Patient is participating in a Managed Medicaid Plan: No, self pay only  Engaged with patient by telephone (pt wife also on call with pt permission) for initial visit for Heart and Vascular Care Coordination.                                                                                                   Assessment:                                     LCSW received a call back from pt and pt wife Elijah, I shared with Elijah pt does not have a DPR and encouraged them to f/u with that next appt so we can speak without having to get verbal approval from pt each time. Pt wife confirms home address, establishing with [facility] this month for primary care. 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 They have spoken with DSS but are confused b/c they were told they were speaking with the wrong department and sent back to the queue. They are agreeable to me sending their information to DSS caseworkers at [facility] so they can see if any account notes/assist with applying. I cautioned per notes that they may be over income at this time.   If for some reason they are over income I shared that we can assist with applying for the [employer] application and MedAssist programs. Pt has already applied for Brilinta  patient assistance.  Pt wife shares they had signed up for something through marketplace but are still keen to see if eligible for medicaid. She is agreeable to additional resources discussed above and I will send to them along with some food resources. Discussed if pt remains out of work and additional assistance needed for housing/utilities to reach out to our team.   HRT/VAS Care Coordination      Patients Home Cardiology Office [facility]   Outpatient Care Team Social Worker   Social Worker Name: Marit Lark, [place], [phone]   Living arrangements for the past 2 months Single Family Home   Lives with: Spouse; Adult Children   Patient Current Insurance Coverage Self-Pay   Patient Has Concern With Paying Medical Bills Yes   Patient Concerns With Medical Bills uninsured, large hospital bill   Medical Bill Referrals: Medicaid referral to DSS, referral for CAFA if ineligible   Does Patient Have Prescription Coverage? No   Patient Prescription Assistance Programs Patient Assistance Programs; [place] Medassist       Social History:  SDOH Screenings   Food Insecurity: Food Insecurity Present (09/24/2023)  Housing: Low Risk  (09/24/2023)  Transportation Needs: No Transportation Needs (09/24/2023)  Utilities: Not At Risk (09/24/2023)  Financial Resource Strain: Medium Risk (09/24/2023)  Tobacco Use: High Risk (09/22/2023)  Health Literacy: Adequate Health Literacy (09/24/2023)    SDOH Interventions: Financial Resources:  Surveyor, Quantity Strain Interventions: Artist, Programmer, Applications Provided DSS for financial assistance and Editor, Commissioning for [employer] Program  Food Insecurity:  Food Insecurity Interventions: Programmer, Applications Provided- [employer] assistance card with [employer]  Housing Insecurity:  Housing Interventions: Intervention Not Indicated  Transportation:   Transportation Interventions: Intervention Not Indicated    Other Care Navigation Interventions:     Provided Pharmacy assistance resources Patient Assistance Programs, [place] Medassist   Follow-up plan:   LCSW mailed pt the following; my card, cone financial assistance application, and food resources. I also sent pt information to Tinnie Devonshire with [facility] DSS so she could reach out to them regarding  Medicaid applications and assistance. No additional questions at this time. Will f/u once I hear determination from DSS.

## 2023-09-25 ENCOUNTER — Telehealth (HOSPITAL_BASED_OUTPATIENT_CLINIC_OR_DEPARTMENT_OTHER): Payer: Self-pay | Admitting: Licensed Clinical Social Worker

## 2023-09-25 ENCOUNTER — Other Ambulatory Visit (HOSPITAL_COMMUNITY): Payer: Self-pay

## 2023-09-25 NOTE — Telephone Encounter (Signed)
 H&V Care Navigation CSW Progress Note  Clinical Social Worker contacted patient by phone to f/u on email received from DSS Caseworker Tinnie Devonshire.   Good news - we were able to approve [name] and [name] for Medicaid expansion.  Medicaid ID #s: [id number] [name]) & [id number] [name], DOB 03/10/1969).  They are both approved for 09/23/23 - 09/21/24.  He is also approved for 08/23/23 - 09/22/23 retro coverage.  I was unable to reach pt today at [phone], left voicemail letting him know I had received updated email for approval. I will re-attempt pt again next week.   I also securely contacted financial counselor Antonio with [facility] so that he could add retro coverage to pt account for hospital bill.   Patient is participating in a Managed Medicaid Plan:  Colton White [place]- [id number] N  SDOH Screenings   Food Insecurity: Food Insecurity Present (09/24/2023)  Housing: Low Risk  (09/24/2023)  Transportation Needs: No Transportation Needs (09/24/2023)  Utilities: Not At Risk (09/24/2023)  Financial Resource Strain: Medium Risk (09/24/2023)  Tobacco Use: High Risk (09/22/2023)  Health Literacy: Adequate Health Literacy (09/24/2023)    Marit Lark, MSW, LCSW Clinical Social Worker II [facility] Heart/Vascular Care Navigation  [phone]- work cell phone (preferred) [phone]- desk phone

## 2023-09-28 ENCOUNTER — Other Ambulatory Visit (HOSPITAL_COMMUNITY): Payer: Self-pay

## 2023-09-28 ENCOUNTER — Other Ambulatory Visit (HOSPITAL_BASED_OUTPATIENT_CLINIC_OR_DEPARTMENT_OTHER): Payer: Self-pay

## 2023-09-28 ENCOUNTER — Telehealth: Payer: Self-pay | Admitting: Licensed Clinical Social Worker

## 2023-09-28 NOTE — Telephone Encounter (Signed)
 The patient has 2 insurances (healthy blue pi#[id number] and [employer] bc ins pi#[id number]). Brilinta  was 25.00 on independence bc but 4.00 on healthy blue. Healthy blue said they are only aware of their insurance so healthy blue hast to be ran as primary until they receive the patients other insurance information from the patient. Pt just got brilinta  on 09/11/23 for 30 days so should be good for now.

## 2023-09-28 NOTE — Telephone Encounter (Signed)
 H&V Care Navigation CSW Progress Note  Clinical Social Worker contacted caregiver by phone to f/u on pt Medicaid. Left voicemail for pt Friday, no call back. Left second message on pt wife's voicemail this time at [phone]. Will re-attempt pt tomorrow if no return call. Pt has research officer, trade union and now Medicaid.  Patient is participating in a Managed Medicaid Plan:  Yes  SDOH Screenings   Food Insecurity: Food Insecurity Present (09/24/2023)  Housing: Low Risk  (09/24/2023)  Transportation Needs: No Transportation Needs (09/24/2023)  Utilities: Not At Risk (09/24/2023)  Financial Resource Strain: Medium Risk (09/24/2023)  Tobacco Use: High Risk (09/22/2023)  Health Literacy: Adequate Health Literacy (09/24/2023)    Marit Lark, MSW, LCSW Clinical Social Worker II [facility] Heart/Vascular Care Navigation  [phone]- work cell phone (preferred) [phone]- desk phone

## 2023-09-28 NOTE — Telephone Encounter (Signed)
 H&V Care Navigation CSW Progress Note  Clinical Social Worker received call back from pt wife. She confirmed they are aware of Medicaid approval and have that number. They will call billing to ensure it is on all the patients accounts. They will call today to cancel the other plan through marketplace. Pt wife called but they needed to speak to pt so they will call later today. No additional questions. Understand resources sent, no need to complete CAFA at this time as outstanding balances should be covered by CAFA. Encouraged them to call me as needed moving forward.  Patient is participating in a Managed Medicaid Plan:  Yes- Healthy Blue  SDOH Screenings   Food Insecurity: Food Insecurity Present (09/24/2023)  Housing: Low Risk  (09/24/2023)  Transportation Needs: No Transportation Needs (09/24/2023)  Utilities: Not At Risk (09/24/2023)  Financial Resource Strain: Medium Risk (09/24/2023)  Tobacco Use: High Risk (09/22/2023)  Health Literacy: Adequate Health Literacy (09/24/2023)    Marit Lark, MSW, LCSW Clinical Social Worker II [facility] Heart/Vascular Care Navigation  [phone]- work cell phone (preferred) [phone]- desk phone

## 2023-09-29 ENCOUNTER — Telehealth (INDEPENDENT_AMBULATORY_CARE_PROVIDER_SITE_OTHER): Payer: Self-pay | Admitting: Primary Care

## 2023-09-29 NOTE — Telephone Encounter (Signed)
 Called pt to confirm apt. VM left

## 2023-09-30 ENCOUNTER — Other Ambulatory Visit (HOSPITAL_BASED_OUTPATIENT_CLINIC_OR_DEPARTMENT_OTHER): Payer: Self-pay

## 2023-09-30 ENCOUNTER — Encounter (INDEPENDENT_AMBULATORY_CARE_PROVIDER_SITE_OTHER): Payer: Self-pay | Admitting: Primary Care

## 2023-09-30 ENCOUNTER — Ambulatory Visit (INDEPENDENT_AMBULATORY_CARE_PROVIDER_SITE_OTHER): Payer: Medicaid Other | Admitting: Primary Care

## 2023-09-30 VITALS — BP 115/75 | HR 54 | Resp 16 | Ht 68.0 in | Wt 137.2 lb

## 2023-09-30 DIAGNOSIS — Z7689 Persons encountering health services in other specified circumstances: Secondary | ICD-10-CM | POA: Diagnosis not present

## 2023-09-30 DIAGNOSIS — Z23 Encounter for immunization: Secondary | ICD-10-CM | POA: Diagnosis not present

## 2023-09-30 DIAGNOSIS — Z1211 Encounter for screening for malignant neoplasm of colon: Secondary | ICD-10-CM

## 2023-09-30 DIAGNOSIS — Z09 Encounter for follow-up examination after completed treatment for conditions other than malignant neoplasm: Secondary | ICD-10-CM | POA: Diagnosis not present

## 2023-09-30 DIAGNOSIS — F411 Generalized anxiety disorder: Secondary | ICD-10-CM

## 2023-09-30 DIAGNOSIS — Z1159 Encounter for screening for other viral diseases: Secondary | ICD-10-CM

## 2023-09-30 DIAGNOSIS — G4700 Insomnia, unspecified: Secondary | ICD-10-CM

## 2023-09-30 MED ORDER — TRAZODONE HCL 50 MG PO TABS
50.0000 mg | ORAL_TABLET | Freq: Every day | ORAL | 1 refills | Status: DC
  Filled 2023-09-30: qty 30, 30d supply, fill #0

## 2023-09-30 NOTE — Progress Notes (Signed)
 Subjective:  [name] is a 58 y.o. male presents for hospital follow up and establish care.  Patient has given wife permission Colton White to be present at his appointment and also provide additional information when needed. Presented to  ED he was  having intermittent episodes of chest pain 5 months he started feeling more fatigue denies shortness of breath and edema  typically  chest pain resolved relatively quickly. Admit date to the hospital was 09/09/23, patient was discharged from the hospital on 09/11/23, patient was admitted for: CODE STEMI called ST elevation myocardial infarction involving left circumflex coronary artery [facility]), STEMI involving left circumflex coronary artery (HCC) ,Hyperlipidemia and Hypertension. Cardiology completed hospital f/u. Patient has No headache, No chest pain, No abdominal pain - No Nausea, No new weakness tingling or numbness, No Cough - shortness of breath  Anxiety Presents for initial visit. Onset was 1 to 4 weeks ago. The problem has been unchanged. Symptoms include chest pain, excessive worry, insomnia, irritability, nervous/anxious behavior and restlessness. The severity of symptoms is mild. Exacerbated by: not being able to work. The quality of sleep is poor. Nighttime awakenings: several.   Risk factors include recent illness and a major life event. Past treatments include nothing.  Insomnia Primary symptoms: sleep disturbance, difficulty falling asleep, frequent awakening, napping.   The current episode started more than one month. The onset quality is sudden. The problem occurs nightly. The problem has been gradually worsening since onset. The symptoms are aggravated by anxiety and tobacco (stopped day of MI). How many beverages per day that contain caffeine: 0 - 1.  Types of beverages you drink: soda and tea. Nothing relieves the symptoms. Past treatments include nothing. Typical bedtime:  10-11 P.M..  How long after going to bed to you fall  asleep: 15-30 minutes.   Duration of naps:  Other (15 mins - 2-3).  PMH includes: hypertension.  Prior diagnostic workup includes:  No prior workup.    Past Medical History:  Diagnosis Date   DVT (deep venous thrombosis) (HCC) 2007   Hearing deficit 05/31/2013   Hyperlipidemia 09/09/2023   Hypertension 09/09/2023   MI (myocardial infarction) (HCC) 11/01/2011   pt presented to ED with chest pain, ACS work up negative     No Known Allergies  Current Outpatient Medications on File Prior to Visit  Medication Sig Dispense Refill   aspirin  81 MG chewable tablet Chew 1 tablet (81 mg total) by mouth daily. 90 tablet 2   carvedilol  (COREG ) 12.5 MG tablet Take 1 tablet (12.5 mg total) by mouth 2 (two) times daily with a meal. 60 tablet 3   losartan  (COZAAR ) 25 MG tablet Take 1 tablet (25 mg total) by mouth daily. 90 tablet 1   nitroGLYCERIN  (NITROSTAT ) 0.4 MG SL tablet Place 1 tablet (0.4 mg total) under the tongue every 5 (five) minutes x 3 doses as needed for chest pain. 25 tablet 2   rosuvastatin  (CRESTOR ) 40 MG tablet Take 1 tablet (40 mg total) by mouth daily. 30 tablet 3   Study - ARTEMIS - ziltivekimab 15 mg/0.5 mL or placebo SQ injection (PI-Christopher) Inject 0.5 mLs (15 mg total) into the skin every 28 (twenty-eight) days. Investigational Study Drug. Please contact the  Research team for any questions or concerns regarding this medication. 0.5 mL 0   ticagrelor  (BRILINTA ) 90 MG TABS tablet Take 1 tablet (90 mg total) by mouth 2 (two) times daily. 180 tablet 2   No current facility-administered medications on file prior  to visit.    Review of System: Comprehensive ROS Pertinent positive and negative noted in HPI   Objective:  BP 115/75   Pulse (!) 54   Resp 16   Ht 5' 8 (1.727 m)   Wt 137 lb 3.2 oz (62.2 kg)   SpO2 100%   BMI 20.86 kg/m   Filed Weights   09/30/23 0937  Weight: 137 lb 3.2 oz (62.2 kg)   Physical exam: General: Vital signs reviewed.  Patient is  well-developed and well-nourished, xx in no acute distress and cooperative with exam. Head: Normocephalic and atraumatic. Eyes: EOMI, conjunctivae normal, no scleral icterus. HOH  Neck: Supple, trachea midline, normal ROM, no JVD, masses, thyromegaly, or carotid bruit present. Cardiovascular: RRR, S1 normal, S2 normal, no murmurs, gallops, or rubs. Pulmonary/Chest: Clear to auscultation bilaterally, no wheezes, rales, or rhonchi. Abdominal: Soft, non-tender, non-distended, BS +, no masses, organomegaly, or guarding present. Musculoskeletal: No joint deformities, erythema, or stiffness, ROM full and nontender. Extremities: No lower extremity edema bilaterally,  pulses symmetric and intact bilaterally. No cyanosis or clubbing. Neurological: A&O x3, Strength is normal Skin: Warm, dry and intact. No rashes or erythema. Psychiatric: Normal mood and affect. speech and behavior is normal. Cognition and memory are normal.    Assessment:  Colton White was seen today for hospitalization follow-up, anxiety and insomnia.  Diagnoses and all orders for this visit:  Need for hepatitis C screening test -     HCV Ab w Reflex to Quant PCR  Encounter for immunization -     Varicella-zoster vaccine IM -     Tdap vaccine greater than or equal to 7yo IM  Encounter to establish care  Hospital discharge follow-up See HPI Generalized anxiety disorder  Insomnia, unspecified type  Colon cancer screening -     Ambulatory referral to Gastroenterology  Other orders -     traZODone  (DESYREL ) 50 MG tablet; Take 1 tablet (50 mg total) by mouth at bedtime. Take 1/2 at bedtime . If sleep is not obtain may take other half.     This note has been created with Education officer, environmental. Any transcriptional errors are unintentional.   Return in about 2 months (around 11/28/2023) for medical conditions.  Colton SHAUNNA Bohr, NP 10/03/2023, 9:41 PM

## 2023-09-30 NOTE — Patient Instructions (Addendum)
 Trazodone  Tablets What is this medication? TRAZODONE  (TRAZ oh done) treats depression. It increases the amount of serotonin in the brain, a hormone that helps regulate mood. This medicine may be used for other purposes; ask your health care provider or pharmacist if you have questions. COMMON BRAND NAME(S): Desyrel  What should I tell my care team before I take this medication? They need to know if you have any of these conditions: Bipolar disorder Bleeding disorder Glaucoma Heart disease, or previous heart attack Irregular heartbeat or rhythm Kidney disease Liver disease Low levels of sodium in the blood Suicidal thoughts, plans, or attempt by you or a family member An unusual or allergic reaction to trazodone , other medications, foods, dyes, or preservatives Pregnant or trying to get pregnant Breastfeeding How should I use this medication? Take this medication by mouth with a glass of water. Take it as directed on the prescription label at the same time every day. Take this medication shortly after a meal or a light snack. Keep taking this medication unless your care team tells you to stop. Stopping it too quickly can cause serious side effects. It can also make your condition worse. A special MedGuide will be given to you by the pharmacist with each prescription and refill. Be sure to read this information carefully each time. Talk to your care team about the use of this medication in children. Special care may be needed. Overdosage: If you think you have taken too much of this medicine contact a poison control center or emergency room at once. NOTE: This medicine is only for you. Do not share this medicine with others. What if I miss a dose? If you miss a dose, take it as soon as you can. If it is almost time for your next dose, take only that dose. Do not take double or extra doses. What may interact with this medication? Do not take this medication with any of the following: Certain  medications for fungal infections, such as fluconazole, itraconazole, ketoconazole, posaconazole, voriconazole Cisapride Dronedarone Linezolid MAOIs, such as Carbex, Eldepryl, Marplan, Nardil, and Parnate Mesoridazine Methylene blue (injected into a vein) Pimozide Saquinavir Thioridazine This medication may also interact with the following: Alcohol Antiviral medications for HIV or AIDS Aspirin  and aspirin -like medications Barbiturates, such as phenobarbital Certain medications for blood pressure, heart disease, irregular heart beat Certain medications for mental health conditions Certain medications for migraine headache, such as almotriptan, eletriptan, frovatriptan, naratriptan, rizatriptan, sumatriptan, zolmitriptan Certain medications for seizures, such as carbamazepine and phenytoin Certain medications for sleep Certain medications that treat or prevent blood clots, such as dalteparin, enoxaparin , warfarin Digoxin Fentanyl Lithium NSAIDS, medications for pain and inflammation, such as ibuprofen  or naproxen  Other medications that cause heart rhythm changes Rasagiline Supplements, such as St. John's wort, kava kava, valerian Tramadol  Tryptophan This list may not describe all possible interactions. Give your health care provider a list of all the medicines, herbs, non-prescription drugs, or dietary supplements you use. Also tell them if you smoke, drink alcohol, or use illegal drugs. Some items may interact with your medicine. What should I watch for while using this medication? Visit your care team for regular checks on your progress. Tell your care team if your symptoms do not start to get better or if they get worse. Because it may take several weeks to see the full effects of this medication, it is important to continue your treatment as prescribed by your care team. Watch for new or worsening thoughts of suicide or depression. This  includes sudden changes in mood, behaviors,  or thoughts. These changes can happen at any time but are more common in the beginning of treatment or after a change in dose. Call your care team right away if you experience these thoughts or worsening depression. This medication may cause mood and behavior changes, such as anxiety, nervousness, irritability, hostility, restlessness, excitability, hyperactivity, or trouble sleeping. These changes can happen at any time but are more common in the beginning of treatment or after a change in dose. Call your care team right away if you notice any of these symptoms. This medication may affect your coordination, reaction time, or judgment. Do not drive or operate machinery until you know how this medication affects you. Sit up or stand slowly to reduce the risk of dizzy or fainting spells. Drinking alcohol with this medication can increase the risk of these side effects. This medication may cause dry eyes and blurred vision. If you wear contact lenses you may feel some discomfort. Lubricating drops may help. See your care team if the problem does not go away or is severe. Your mouth may get dry. Chewing sugarless gum or sucking hard candy and drinking plenty of water may help. Contact your care team if the problem does not go away or is severe. What side effects may I notice from receiving this medication? Side effects that you should report to your care team as soon as possible: Allergic reactions--skin rash, itching, hives, swelling of the face, lips, tongue, or throat Bleeding--bloody or black, tar-like stools, red or dark brown urine, vomiting blood or brown material that looks like coffee grounds, small, red or purple spots on skin, unusual bleeding or bruising Heart rhythm changes--fast or irregular heartbeat, dizziness, feeling faint or lightheaded, chest pain, trouble breathing Low blood pressure--dizziness, feeling faint or lightheaded, blurry vision Low sodium level--muscle weakness, fatigue,  dizziness, headache, confusion Prolonged or painful erection Serotonin syndrome--irritability, confusion, fast or irregular heartbeat, muscle stiffness, twitching muscles, sweating, high fever, seizures, chills, vomiting, diarrhea Sudden eye pain or change in vision such as blurry vision, seeing halos around lights, vision loss Thoughts of suicide or self-harm, worsening mood, feelings of depression Side effects that usually do not require medical attention (report to your care team if they continue or are bothersome): Change in sex drive or performance Constipation Dizziness Drowsiness Dry mouth This list may not describe all possible side effects. Call your doctor for medical advice about side effects. You may report side effects to FDA at 1-800-FDA-1088. Where should I keep my medication? Keep out of the reach of children and pets. Store at room temperature between 15 and 30 degrees C (59 to 86 degrees F). Protect from light. Keep container tightly closed. Throw away any unused medication after the expiration date. NOTE: This sheet is a summary. It may not cover all possible information. If you have questions about this medicine, talk to your doctor, pharmacist, or health care provider.  2024 Elsevier/Gold Standard (2022-09-04 00:00:00) Zoster Vaccine Injection What is this medication? ZOSTER VACCINE (ZOS ter vak SEEN) reduces the risk of herpes zoster (shingles). It does not treat shingles. It is still possible to get shingles after receiving the vaccine, but the symptoms may be less severe or not last as long. It works by helping your immune system learn how to fight off a future infection. This medicine may be used for other purposes; ask your health care provider or pharmacist if you have questions. COMMON BRAND NAME(S): SHINGRIX  What should I tell  my care team before I take this medication? They need to know if you have any of these conditions: Cancer Immune system problems An  unusual or allergic reaction to Zoster vaccine, other medications, foods, dyes, or preservatives Pregnant or trying to get pregnant Breastfeeding How should I use this medication? This vaccine is injected into a muscle. It is given by your care team. This vaccine requires 2 doses to get the full benefit. Set a reminder for when your next dose is due. A copy of Vaccine Information Statements will be given before each vaccination. Be sure to read this information carefully each time. This sheet may change often. Talk to your care team about the use of this vaccine in children. This vaccine is not approved for use in children. Overdosage: If you think you have taken too much of this medicine contact a poison control center or emergency room at once. NOTE: This medicine is only for you. Do not share this medicine with others. What if I miss a dose? Keep appointments for follow-up (booster) doses. It is important not to miss your dose. Call your care team if you are unable to keep an appointment. What may interact with this medication? Medications that suppress your immune system Medications to treat cancer Steroid medications, such as prednisone or cortisone This list may not describe all possible interactions. Give your health care provider a list of all the medicines, herbs, non-prescription drugs, or dietary supplements you use. Also tell them if you smoke, drink alcohol, or use illegal drugs. Some items may interact with your medicine. What should I watch for while using this medication? Visit your care team regularly. This vaccine, like all vaccines, may not fully protect everyone. What side effects may I notice from receiving this medication? Side effects that you should report to your care team as soon as possible: Allergic reactions--skin rash, itching, hives, swelling of the face, lips, tongue, or throat Side effects that usually do not require medical attention (report these to your care  team if they continue or are bothersome): Chills Fatigue Feeling faint or lightheaded Fever Headache Muscle pain Pain, redness, or irritation at injection site This list may not describe all possible side effects. Call your doctor for medical advice about side effects. You may report side effects to FDA at 1-800-FDA-1088. Where should I keep my medication? This vaccine is only given by your care team. It will not be stored at home. NOTE: This sheet is a summary. It may not cover all possible information. If you have questions about this medicine, talk to your doctor, pharmacist, or health care provider.  2024 Elsevier/Gold Standard (2022-02-27 00:00:00)   Td (Tetanus, Diphtheria) Vaccine: What You Need to Know Many vaccine information statements are available in Spanish and other languages. See promoage.com.br. 1. Why get vaccinated? Td vaccine can prevent tetanus and diphtheria. Tetanus enters the body through cuts or wounds. Diphtheria spreads from person to person. TETANUS (T) causes painful stiffening of the muscles. Tetanus can lead to serious health problems, including being unable to open the mouth, having trouble swallowing and breathing, or death. DIPHTHERIA (D) can lead to difficulty breathing, heart failure, paralysis, or death. 2. Td vaccine Td is only for children 7 years and older, adolescents, and adults.  Td is usually given as a booster dose every 10 years, or after 5 years in the case of a severe or dirty wound or burn. Another vaccine, called Tdap, may be used instead of Td. Tdap protects against pertussis,  also known as whooping cough, in addition to tetanus and diphtheria. Td may be given at the same time as other vaccines. 3. Talk with your health care provider Tell your vaccination provider if the person getting the vaccine: Has had an allergic reaction after a previous dose of any vaccine that protects against tetanus or diphtheria, or has any severe,  life-threatening allergies Has ever had Guillain-Barr Syndrome (also called GBS) Has had severe pain or swelling after a previous dose of any vaccine that protects against tetanus or diphtheria In some cases, your health care provider may decide to postpone Td vaccination until a future visit. People with minor illnesses, such as a cold, may be vaccinated. People who are moderately or severely ill should usually wait until they recover before getting Td vaccine.  Your health care provider can give you more information. 4. Risks of a vaccine reaction Pain, redness, or swelling where the shot was given, mild fever, headache, feeling tired, and nausea, vomiting, diarrhea, or stomachache sometimes happen after Td vaccination. People sometimes faint after medical procedures, including vaccination. Tell your provider if you feel dizzy or have vision changes or ringing in the ears.  As with any medicine, there is a very remote chance of a vaccine causing a severe allergic reaction, other serious injury, or death. 5. What if there is a serious problem? An allergic reaction could occur after the vaccinated person leaves the clinic. If you see signs of a severe allergic reaction (hives, swelling of the face and throat, difficulty breathing, a fast heartbeat, dizziness, or weakness), call 9-1-1 and get the person to the nearest hospital.  For other signs that concern you, call your health care provider.  Adverse reactions should be reported to the Vaccine Adverse Event Reporting System (VAERS). Your health care provider will usually file this report, or you can do it yourself. Visit the VAERS website at www.vaers.lagents.no or call 779-886-7186. VAERS is only for reporting reactions, and VAERS staff members do not give medical advice. 6. The National Vaccine Injury Compensation Program The [employer] Vaccine Injury Compensation Program (VICP) is a federal program that was created to compensate people who may  have been injured by certain vaccines. Claims regarding alleged injury or death due to vaccination have a time limit for filing, which may be as short as two years. Visit the VICP website at spiritualword.at or call (979)248-6935 to learn about the program and about filing a claim. 7. How can I learn more? Ask your health care provider. Call your local or state health department. Visit the website of the Food and Drug Administration (FDA) for vaccine package inserts and additional information at finderlist.no. Contact the Centers for Disease Control and Prevention (CDC): Call 985-364-4640 (1-800-CDC-INFO) or Visit CDC's website at piccapture.uy. Source: CDC Vaccine Information Statement Td (Tetanus, Diphtheria) Vaccine (04/27/2020) This same material is available at footballexhibition.com.br for no charge. This information is not intended to replace advice given to you by your health care provider. Make sure you discuss any questions you have with your health care provider. Document Revised: 12/24/2022 Document Reviewed: 10/24/2022 Elsevier Patient Education  2024 [employer].

## 2023-10-01 LAB — HCV INTERPRETATION

## 2023-10-01 LAB — HCV AB W REFLEX TO QUANT PCR: HCV Ab: NONREACTIVE

## 2023-10-05 ENCOUNTER — Other Ambulatory Visit (HOSPITAL_BASED_OUTPATIENT_CLINIC_OR_DEPARTMENT_OTHER): Payer: Self-pay

## 2023-10-06 ENCOUNTER — Other Ambulatory Visit (HOSPITAL_BASED_OUTPATIENT_CLINIC_OR_DEPARTMENT_OTHER): Payer: Self-pay

## 2023-10-08 VITALS — BP 128/72 | HR 70 | Ht 68.0 in

## 2023-10-08 DIAGNOSIS — Z006 Encounter for examination for normal comparison and control in clinical research program: Secondary | ICD-10-CM

## 2023-10-08 MED ORDER — STUDY - ARTEMIS - ZILTIVEKIMAB 15 MG/0.5 ML OR PLACEBO SQ INJECTION (PI-CHRISTOPHER): 15.0000 mg | INJECTION | SUBCUTANEOUS | 0 refills | Status: DC

## 2023-10-08 MED ORDER — STUDY - ARTEMIS - ZILTIVEKIMAB 15 MG/0.5 ML OR PLACEBO SQ INJECTION (PI-CHRISTOPHER)
15.0000 mg | INJECTION | Freq: Once | SUBCUTANEOUS | Status: AC
  Administered 2023-10-08: 15 mg via SUBCUTANEOUS
  Filled 2023-10-08: qty 0.5

## 2023-10-08 NOTE — Research (Addendum)
  ARTEMIS V3 (Month 1 +/-3 days)  Were all eligibility criteria met to continue in study? [x] Yes [] No   Hep B DNA monitoring: [] Yes [x] No    Concomitant meds: [x] Yes [] No    Height, VS: [x] Yes [] No VS: Subject resting for >5 mins before 3 sets of VS taken.    Any hospitalizations/Adverse Events identified: [] Yes [x] No   Central Lab assessments drawn: [x] Yes [] No   Hs-CRP, Lipids, biochemistry/hematology, PK sampling, immunogenicity assessments   Pregnancy Test Was the sample collected? [] Yes [] No [x] N/A Was the collection date the same as the visit date? [] Yes [] No Specimen Type: [] Serum [] Urine Collection Date: Collection Time: Pregnancy Test Result: [] Positive [] Negative [] Borderline [] Invalid   Administer training of study intervention and dosing instructions and supervised self administration of study intervention during the site visit: [x] Yes [] No  Subject given two pens. Subject successfully self administered study drug with one pen in LL abdomen at 0916 without complication. Pen placed in sharps container. Subject took second home pen for home dose. Kit# [id number].   Study Drug dispensed via [facility]: [x] Yes [] No   Ensure updated contact person list: [x] Yes [] No   Subject given DFU and dosing diary: [x] Yes [] No   Current Outpatient Medications:    aspirin 81 MG chewable tablet, Chew 1 tablet (81 mg total) by mouth daily., Disp: 90 tablet, Rfl: 2   carvedilol (COREG) 12.5 MG tablet, Take 1 tablet (12.5 mg total) by mouth 2 (two) times daily with a meal., Disp: 60 tablet, Rfl: 3   losartan (COZAAR) 25 MG tablet, Take 1 tablet (25 mg total) by mouth daily., Disp: 90 tablet, Rfl: 1   nitroGLYCERIN (NITROSTAT) 0.4 MG SL tablet, Place 1 tablet (0.4 mg total) under the tongue every 5 (five) minutes x 3 doses as needed for chest pain., Disp: 25 tablet, Rfl: 2   rosuvastatin (CRESTOR) 40 MG tablet, Take 1 tablet (40 mg total) by mouth daily., Disp: 30 tablet, Rfl: 3    ticagrelor (BRILINTA) 90 MG TABS tablet, Take 1 tablet (90 mg total) by mouth 2 (two) times daily., Disp: 180 tablet, Rfl: 2   Study - ARTEMIS - ziltivekimab 15 mg/0.5 mL or placebo SQ injection (PI-Christopher), Inject 0.5 mLs (15 mg total) into the skin every 28 (twenty-eight) days. Investigational Study Drug. Please contact the [facility] team for any questions or concerns regarding this medication., Disp: 1 mL, Rfl: 0   traZODone (DESYREL) 50 MG tablet, Take up to 1 tablet (50 mg total) by mouth at bedtime. Take 1/2 tablet at bedtime . If sleep is not obtained, may take other half. (Patient not taking: Reported on 10/08/2023), Disp: 30 tablet, Rfl: 1     Are there any labs that are clinically significant?  Yes []  OR No[x]

## 2023-10-27 ENCOUNTER — Other Ambulatory Visit (HOSPITAL_BASED_OUTPATIENT_CLINIC_OR_DEPARTMENT_OTHER): Payer: Self-pay

## 2023-10-27 MED ORDER — AMOXICILLIN 500 MG PO CAPS
500.0000 mg | ORAL_CAPSULE | Freq: Four times a day (QID) | ORAL | 0 refills | Status: DC
  Filled 2023-10-27: qty 28, 7d supply, fill #0

## 2023-11-09 ENCOUNTER — Telehealth: Payer: Self-pay

## 2023-11-09 DIAGNOSIS — Z006 Encounter for examination for normal comparison and control in clinical research program: Secondary | ICD-10-CM

## 2023-11-09 NOTE — Research (Signed)
  ARTEMIS Phone visit P4 (Month 2 +/-3 days)  Were all eligibility criteria met to continue in study? [x] Yes [] No   Concomitant meds: [x] Yes [] No    Any hospitalizations/Adverse Events identified: [] Yes [x] No   Pregnancy Test Was the sample collected? [] Yes [] No [x] N/A Was the collection date the same as the visit date? [] Yes [] No Specimen Type: [] Serum [] Urine Collection Date: Collection Time: Pregnancy Test Result: [] Positive [] Negative [] Borderline [] Invalid   Assess dosing and administration conditions: Yes [x]  No[]  Subject successfully administered study drug with pen in R lower abdomen on 11/08/23 around 1930 without complication. Subject verbalized understanding to keep pen and box and bring back to next appt.    Ensure updated contact person list: Yes [x]  No[]    Current Outpatient Medications:    amoxicillin (AMOXIL) 500 MG capsule, Take 1 capsule (500 mg total) by mouth every 6 (six) hours. (Patient not taking: Reported on 11/09/2023), Disp: 28 capsule, Rfl: 0   aspirin 81 MG chewable tablet, Chew 1 tablet (81 mg total) by mouth daily., Disp: 90 tablet, Rfl: 2   carvedilol (COREG) 12.5 MG tablet, Take 1 tablet (12.5 mg total) by mouth 2 (two) times daily with a meal., Disp: 60 tablet, Rfl: 3   losartan (COZAAR) 25 MG tablet, Take 1 tablet (25 mg total) by mouth daily., Disp: 90 tablet, Rfl: 1   nitroGLYCERIN (NITROSTAT) 0.4 MG SL tablet, Place 1 tablet (0.4 mg total) under the tongue every 5 (five) minutes x 3 doses as needed for chest pain., Disp: 25 tablet, Rfl: 2   rosuvastatin (CRESTOR) 40 MG tablet, Take 1 tablet (40 mg total) by mouth daily., Disp: 30 tablet, Rfl: 3   Study - ARTEMIS - ziltivekimab 15 mg/0.5 mL or placebo SQ injection (PI-Christopher), Inject 0.5 mLs (15 mg total) into the skin every 28 (twenty-eight) days. Investigational Study Drug. Please contact the [facility] team for any questions or concerns regarding this medication., Disp: 1 mL, Rfl: 0    ticagrelor (BRILINTA) 90 MG TABS tablet, Take 1 tablet (90 mg total) by mouth 2 (two) times daily., Disp: 180 tablet, Rfl: 2   traZODone (DESYREL) 50 MG tablet, Take up to 1 tablet (50 mg total) by mouth at bedtime. Take 1/2 tablet at bedtime . If sleep is not obtained, may take other half. (Patient not taking: Reported on 11/09/2023), Disp: 30 tablet, Rfl: 1

## 2023-11-09 NOTE — Telephone Encounter (Signed)
   Patient Name: Colton White  DOB: 1966-01-25 MRN: [id number]  Primary Cardiologist: Peter Swaz[place], MD  Chart reviewed as part of pre-operative protocol coverage.   Dental extractions of 1-2 teeth are considered low risk procedures per guidelines and generally do not require any specific cardiac clearance. It is also generally accepted that for extractions of 1-2 teeth and dental cleanings, there is no need to interrupt blood thinner therapy.  SBE prophylaxis is not required for the patient from a cardiac standpoint.  I will route this recommendation to the requesting party via Epic fax function and remove from pre-op pool.  Please call with questions.  Carlos Levering, NP 11/09/2023, 4:41 PM

## 2023-11-09 NOTE — Telephone Encounter (Signed)
   Pre-operative Risk Assessment    Patient Name: Colton White  DOB: [birth year] MRN: [id number]   Date of last office visit: 09/22/23 Date of next office visit: 12/18/23  Request for Surgical Clearance    Procedure:  Dental Extraction - Amount of Teeth to be Pulled:  2  Date of Surgery:  Clearance TBD                                Surgeon:  Dr. Cathrine Muster  Surgeon's Group or Practice Name: [facility]  Phone number:  [phone] Fax number:  [phone]    Type of Clearance Requested:   - Medical  - Pharmacy:  Hold Ticagrelor (Brilinta)     Type of Anesthesia:  Local    Additional requests/questions:    SignedMarilynn Rail   11/09/2023, 4:11 PM

## 2023-11-14 ENCOUNTER — Emergency Department (HOSPITAL_BASED_OUTPATIENT_CLINIC_OR_DEPARTMENT_OTHER)
Admission: EM | Admit: 2023-11-14 | Discharge: 2023-11-14 | Disposition: A | Discharge status: Home / Self Care | Payer: Medicaid Other | Attending: Emergency Medicine | Admitting: Emergency Medicine

## 2023-11-14 ENCOUNTER — Encounter (HOSPITAL_BASED_OUTPATIENT_CLINIC_OR_DEPARTMENT_OTHER): Payer: Self-pay | Admitting: *Deleted

## 2023-11-14 ENCOUNTER — Other Ambulatory Visit: Payer: Self-pay

## 2023-11-14 DIAGNOSIS — Z7982 Long term (current) use of aspirin: Secondary | ICD-10-CM | POA: Insufficient documentation

## 2023-11-14 DIAGNOSIS — Z79899 Other long term (current) drug therapy: Secondary | ICD-10-CM | POA: Insufficient documentation

## 2023-11-14 DIAGNOSIS — I2510 Atherosclerotic heart disease of native coronary artery without angina pectoris: Secondary | ICD-10-CM | POA: Insufficient documentation

## 2023-11-14 DIAGNOSIS — K068 Other specified disorders of gingiva and edentulous alveolar ridge: Secondary | ICD-10-CM | POA: Diagnosis not present

## 2023-11-14 MED ORDER — TRANEXAMIC ACID 1000 MG/10ML IV SOLN
500.0000 mg | Freq: Once | INTRAVENOUS | Status: AC
  Administered 2023-11-14: 500 mg via TOPICAL
  Filled 2023-11-14: qty 10

## 2023-11-14 NOTE — Discharge Instructions (Signed)
 I would hold pressure in that area that we discussed if he started to have increased bleeding return if symptoms worsen.  Dr. Kenney Houseman is an oral surgeon that you can consider calling tomorrow if needed as well.

## 2023-11-14 NOTE — ED Notes (Signed)
 Discharge paperwork reviewed entirely with patient, including follow up care. Pain was under control. No prescriptions were called in, but all questions were addressed.  Pt verbalized understanding as well as all parties involved. No questions or concerns voiced at the time of discharge. No acute distress noted.   Pt ambulated out to [facility] without incident or assistance.  Pt advised they will seek followup care with a specialist and followup with their PCP.

## 2023-11-14 NOTE — ED Triage Notes (Signed)
 Pt had three teeth removed at 11 am and has been bleeding since.  Dentist  office closed at 2pm so pt was unable to reach them.  Two areas that appear to have clotted blood in upper jaw, no acute bleeding noted in triage.  Pt is on blood thinner brilinta.

## 2023-11-14 NOTE — ED Notes (Signed)
 New gauze given and pt advised to bite down on new gauze so we may observe the amount of bleeding.

## 2023-11-14 NOTE — ED Provider Notes (Signed)
 [facility] Provider Note   CSN: [id number] Arrival date & time: 11/14/23  1417     History  Chief Complaint  Patient presents with   Dental Problem    Brick Ketcher is a 58 y.o. male.  Patient here with bleeding from his teeth after he had tooth extraction a couple hours ago.  He is on Brilinta for CAD.  He was told he need to continue his Brilinta but he had tooth infection they removed his teeth today.  He is told that bleeding should have stopped about an hour after the procedure but has continued.  He says this has been a small amount of oozing.  Nothing makes it worse or better.  The history is provided by the patient.       Home Medications Prior to Admission medications   Medication Sig Start Date End Date Taking? Authorizing Provider  amoxicillin (AMOXIL) 500 MG capsule Take 1 capsule (500 mg total) by mouth every 6 (six) hours. Patient not taking: Reported on 11/09/2023 10/27/23     aspirin 81 MG chewable tablet Chew 1 tablet (81 mg total) by mouth daily. 09/11/23   Arty Baumgartner, NP  carvedilol (COREG) 12.5 MG tablet Take 1 tablet (12.5 mg total) by mouth 2 (two) times daily with a meal. 09/22/23   Jodelle Gross, NP  losartan (COZAAR) 25 MG tablet Take 1 tablet (25 mg total) by mouth daily. 09/11/23   Arty Baumgartner, NP  nitroGLYCERIN (NITROSTAT) 0.4 MG SL tablet Place 1 tablet (0.4 mg total) under the tongue every 5 (five) minutes x 3 doses as needed for chest pain. 09/22/23   Jodelle Gross, NP  rosuvastatin (CRESTOR) 40 MG tablet Take 1 tablet (40 mg total) by mouth daily. 09/22/23   Jodelle Gross, NP  Study - ARTEMIS - ziltivekimab 15 mg/0.5 mL or placebo SQ injection (PI-Christopher) Inject 0.5 mLs (15 mg total) into the skin every 28 (twenty-eight) days. Investigational Study Drug. Please contact the [facility] team for any questions or concerns regarding this medication. 10/08/23    Jodelle Red, MD  ticagrelor (BRILINTA) 90 MG TABS tablet Take 1 tablet (90 mg total) by mouth 2 (two) times daily. 09/11/23   Arty Baumgartner, NP  traZODone (DESYREL) 50 MG tablet Take up to 1 tablet (50 mg total) by mouth at bedtime. Take 1/2 tablet at bedtime . If sleep is not obtained, may take other half. Patient not taking: Reported on 11/09/2023 09/30/23   Grayce Sessions, NP      Allergies    Patient has no known allergies.    Review of Systems   Review of Systems  Physical Exam Updated Vital Signs BP (!) 158/78 (BP Location: Right Arm)   Pulse 62   Temp 97.8 F (36.6 C)   Resp 16   SpO2 100%  Physical Exam Vitals and nursing note reviewed.  Constitutional:      General: He is not in acute distress.    Appearance: He is well-developed. He is not ill-appearing.  HENT:     Head: Normocephalic and atraumatic.     Comments: Very scant using from 2 over the 3 teeth extraction sites to the left upper mouth there are no large clots being spit up or significant amount of bleeding    Nose: Nose normal.     Mouth/Throat:     Mouth: Mucous membranes are moist.  Eyes:     Extraocular  Movements: Extraocular movements intact.     Conjunctiva/sclera: Conjunctivae normal.     Pupils: Pupils are equal, round, and reactive to light.  Cardiovascular:     Rate and Rhythm: Normal rate and regular rhythm.     Pulses: Normal pulses.     Heart sounds: Normal heart sounds. No murmur heard. Pulmonary:     Effort: Pulmonary effort is normal. No respiratory distress.     Breath sounds: Normal breath sounds.  Abdominal:     General: Abdomen is flat.     Palpations: Abdomen is soft.     Tenderness: There is no abdominal tenderness.  Musculoskeletal:        General: No swelling.     Cervical back: Normal range of motion and neck supple.  Skin:    General: Skin is warm and dry.     Capillary Refill: Capillary refill takes less than 2 seconds.  Neurological:     General: No  focal deficit present.     Mental Status: He is alert.  Psychiatric:        Mood and Affect: Mood normal.     ED Results / Procedures / Treatments   Labs (all labs ordered are listed, but only abnormal results are displayed) Labs Reviewed - No data to display  EKG None  Radiology No results found.  Procedures Procedures    Medications Ordered in ED Medications  tranexamic acid (CYKLOKAPRON) injection 500 mg (500 mg Topical Given by Other 11/14/23 1503)    ED Course/ Medical Decision Making/ A&P                                 Medical Decision Making Risk Prescription drug management.   Bakary Bramer is here with bleeding from his gums after teeth removal today.  He had 3 upper left teeth removed about 3 hours ago.  He is on Brilinta for CAD.  He was unable to hold Brilinta for this procedure.  He has been having some small amount of bleeding from the gums since.  He has been biting down on gauze with some improvement.  When I look on exam I do not see any bleeding from the 3 tooth sockets but after doing some soaked TXA and gauze and pledgets I felt pretty confident that this teeth sockets were well clotted.  However he did have a may be half millimeter small abrasion to the hard palate which I wonder from an instrument when removing his teeth that seems to be oozing just slightly.  I think that this is the source of the little bit of intermittent bleeding that he is having.  I showed him how to hold direct pressure in this area.  We soaked pledget in TXA and I held in this area for a few minutes and he held his well and I think bleeding for the most part has stopped but still I think this is an area that might intermittently bleed or slowly bleed here but ultimately I do not think there is any procedure that we can really offer at this time except for direct pressure.  Right now I think bleeding is mostly resolved.  Surgical site looks good there is no bleeding from the  sockets.  I given him number to follow-up with oral surgeon if he is unable to follow-up with his oral surgeon.  He understands return precautions.  Discharged in good condition.  Overall the  amount of bleeding is very minimal.  Is not symptomatic.  This chart was dictated using voice recognition software.  Despite best efforts to proofread,  errors can occur which can change the documentation meaning.         Final Clinical Impression(s) / ED Diagnoses Final diagnoses:  Bleeding gums    Rx / DC Orders ED Discharge Orders     None         Virgina Norfolk, DO 11/14/23 1640

## 2023-11-20 ENCOUNTER — Ambulatory Visit: Payer: Medicaid Other | Admitting: Physician Assistant

## 2023-11-24 DIAGNOSIS — Z955 Presence of coronary angioplasty implant and graft: Secondary | ICD-10-CM | POA: Diagnosis not present

## 2023-11-30 DIAGNOSIS — Z955 Presence of coronary angioplasty implant and graft: Secondary | ICD-10-CM | POA: Diagnosis not present

## 2023-11-30 NOTE — Progress Notes (Unsigned)
 New patient visit   Patient: Colton White   DOB: [birth year]   58 y.o. Male  MRN: [id number] Visit Date: 12/01/2023  Today's healthcare provider: Alfredia Ferguson, PA-C   No chief complaint on file.  Subjective    Colton White is a 58 y.o. male who presents today as a new patient to establish care.   09/09/23 Pt presented to ED with A STEMI, history of DVT. Underwent cardiac cath w/ successful PCO, on ASA and Brilinta x 1 year, referred to cardiac rehab.   Coreg 12.5 bid losartan 25 crestor 40  Smoking cessation,  Past Medical History:  Diagnosis Date   DVT (deep venous thrombosis) (HCC) 2007   Hearing deficit 05/31/2013   Hyperlipidemia 09/09/2023   Hypertension 09/09/2023   MI (myocardial infarction) (HCC) 11/01/2011   pt presented to ED with chest pain, ACS work up negative   Past Surgical History:  Procedure Laterality Date   BACK SURGERY     CORONARY/GRAFT ACUTE MI REVASCULARIZATION N/A 09/09/2023   Procedure: Coronary/Graft Acute MI Revascularization;  Surgeon: [place], Peter M, MD;  Location: [facility] INVASIVE CV LAB;  Service: Cardiovascular;  Laterality: N/A;   Family Status  Relation Name Status   Father  (Not Specified)  No partnership data on file   Family History  Problem Relation Age of Onset   Hypertension Father    Social History   Socioeconomic History   Marital status: Married    Spouse name: Not on file   Number of children: Not on file   Years of education: Not on file   Highest education level: Not on file  Occupational History   Not on file  Tobacco Use   Smoking status: Every Day    Current packs/day: 1.00    Types: Cigarettes   Smokeless tobacco: Never   Tobacco comments:    09/09/2023 patient stopped smoking  Substance and Sexual Activity   Alcohol use: No   Drug use: No   Sexual activity: Not on file  Other Topics Concern   Not on file  Social History Narrative   Not on file   Social Drivers of Health   Financial  Resource Strain: Medium Risk (09/24/2023)   Overall Financial Resource Strain (CARDIA)    Difficulty of Paying Living Expenses: Somewhat hard  Food Insecurity: Food Insecurity Present (09/24/2023)   Hunger Vital Sign    Worried About Running Out of Food in the Last Year: Sometimes true    Ran Out of Food in the Last Year: Never true  Transportation Needs: No Transportation Needs (09/24/2023)   PRAPARE - Administrator, Civil Service (Medical): No    Lack of Transportation (Non-Medical): No  Physical Activity: Not on file  Stress: Not on file  Social Connections: Not on file   Outpatient Medications Prior to Visit  Medication Sig   amoxicillin (AMOXIL) 500 MG capsule Take 1 capsule (500 mg total) by mouth every 6 (six) hours. (Patient not taking: Reported on 11/09/2023)   aspirin 81 MG chewable tablet Chew 1 tablet (81 mg total) by mouth daily.   carvedilol (COREG) 12.5 MG tablet Take 1 tablet (12.5 mg total) by mouth 2 (two) times daily with a meal.   losartan (COZAAR) 25 MG tablet Take 1 tablet (25 mg total) by mouth daily.   nitroGLYCERIN (NITROSTAT) 0.4 MG SL tablet Place 1 tablet (0.4 mg total) under the tongue every 5 (five) minutes x 3 doses as needed for chest pain.  rosuvastatin (CRESTOR) 40 MG tablet Take 1 tablet (40 mg total) by mouth daily.   Study - ARTEMIS - ziltivekimab 15 mg/0.5 mL or placebo SQ injection (PI-Christopher) Inject 0.5 mLs (15 mg total) into the skin every 28 (twenty-eight) days. Investigational Study Drug. Please contact the [facility] team for any questions or concerns regarding this medication.   ticagrelor (BRILINTA) 90 MG TABS tablet Take 1 tablet (90 mg total) by mouth 2 (two) times daily.   traZODone (DESYREL) 50 MG tablet Take up to 1 tablet (50 mg total) by mouth at bedtime. Take 1/2 tablet at bedtime . If sleep is not obtained, may take other half. (Patient not taking: Reported on 11/09/2023)   No facility-administered medications prior to  visit.   No Known Allergies  Immunization History  Administered Date(s) Administered   Tdap 09/30/2023   Zoster Recombinant(Shingrix) 09/30/2023    Health Maintenance  Topic Date Due   Pneumococcal Vaccine 62-72 Years old (1 of 2 - PCV) Never done   Colonoscopy  Never done   COVID-19 Vaccine (1 - 2024-25 season) Never done   Zoster Vaccines- Shingrix (2 of 2) 11/25/2023   INFLUENZA VACCINE  12/21/2023 (Originally 04/23/2023)   DTaP/Tdap/Td (2 - Td or Tdap) 09/29/2033   Hepatitis C Screening  Completed   HIV Screening  Completed   HPV VACCINES  Aged Out    Patient Care Team: Grayce Sessions, NP as PCP - General (Internal Medicine) [place], Peter M, MD as PCP - Cardiology (Cardiology)  Review of Systems  {Insert previous labs (optional):23779} {See past labs  Heme  Chem  Endocrine  Serology  Results Review (optional):1}   Objective    There were no vitals taken for this visit. {Insert last BP/Wt (optional):23777}{See vitals history (optional):1}   Physical Exam ***  Depression Screen    09/30/2023    9:36 AM  PHQ 2/9 Scores  PHQ - 2 Score 2  PHQ- 9 Score 9   No results found for any visits on 12/01/23.  Assessment & Plan     There are no diagnoses linked to this encounter.   No follow-ups on file.      Alfredia Ferguson, PA-C  [facility] [phone] (phone) [phone] (fax)  [facility]

## 2023-12-01 ENCOUNTER — Encounter: Payer: Self-pay | Admitting: Physician Assistant

## 2023-12-01 ENCOUNTER — Ambulatory Visit: Payer: Medicaid Other | Admitting: Physician Assistant

## 2023-12-01 ENCOUNTER — Ambulatory Visit (INDEPENDENT_AMBULATORY_CARE_PROVIDER_SITE_OTHER): Payer: Medicaid Other | Admitting: Primary Care

## 2023-12-01 VITALS — BP 125/65 | HR 62 | Temp 98.7°F | Resp 16 | Ht 66.8 in | Wt 146.0 lb

## 2023-12-01 DIAGNOSIS — I10 Essential (primary) hypertension: Secondary | ICD-10-CM

## 2023-12-01 DIAGNOSIS — Z720 Tobacco use: Secondary | ICD-10-CM | POA: Insufficient documentation

## 2023-12-01 DIAGNOSIS — I252 Old myocardial infarction: Secondary | ICD-10-CM | POA: Diagnosis not present

## 2023-12-01 DIAGNOSIS — E785 Hyperlipidemia, unspecified: Secondary | ICD-10-CM | POA: Diagnosis not present

## 2023-12-01 DIAGNOSIS — Z23 Encounter for immunization: Secondary | ICD-10-CM

## 2023-12-01 DIAGNOSIS — R7303 Prediabetes: Secondary | ICD-10-CM | POA: Insufficient documentation

## 2023-12-01 DIAGNOSIS — Z1211 Encounter for screening for malignant neoplasm of colon: Secondary | ICD-10-CM

## 2023-12-01 DIAGNOSIS — H9193 Unspecified hearing loss, bilateral: Secondary | ICD-10-CM | POA: Diagnosis not present

## 2023-12-01 DIAGNOSIS — D72829 Elevated white blood cell count, unspecified: Secondary | ICD-10-CM | POA: Insufficient documentation

## 2023-12-01 LAB — COMPREHENSIVE METABOLIC PANEL
ALT: 44 U/L (ref 0–53)
AST: 26 U/L (ref 0–37)
Albumin: 4.4 g/dL (ref 3.5–5.2)
Alkaline Phosphatase: 60 U/L (ref 39–117)
BUN: 21 mg/dL (ref 6–23)
CO2: 29 meq/L (ref 19–32)
Calcium: 9.2 mg/dL (ref 8.4–10.5)
Chloride: 103 meq/L (ref 96–112)
Creatinine, Ser: 0.94 mg/dL (ref 0.40–1.50)
GFR: 90.1 mL/min (ref >60.00)
Glucose, Bld: 102 mg/dL — ABNORMAL HIGH (ref 70–99)
Potassium: 4.3 meq/L (ref 3.5–5.1)
Sodium: 139 meq/L (ref 135–145)
Total Bilirubin: 0.6 mg/dL (ref 0.2–1.2)
Total Protein: 6.6 g/dL (ref 6.0–8.3)

## 2023-12-01 LAB — LIPID PANEL
Cholesterol: 98 mg/dL (ref 0–200)
HDL: 41.3 mg/dL (ref >39.00)
LDL Cholesterol: 33 mg/dL (ref 0–99)
NonHDL: 56.87
Total CHOL/HDL Ratio: 2
Triglycerides: 119 mg/dL (ref 0.0–149.0)
VLDL: 23.8 mg/dL (ref 0.0–40.0)

## 2023-12-01 LAB — CBC WITH DIFFERENTIAL/PLATELET
Basophils Absolute: 0.1 10*3/uL (ref 0.0–0.1)
Basophils Relative: 1 % (ref 0.0–3.0)
Eosinophils Absolute: 0.3 10*3/uL (ref 0.0–0.7)
Eosinophils Relative: 3.4 % (ref 0.0–5.0)
HCT: 42.8 % (ref 39.0–52.0)
Hemoglobin: 14.3 g/dL (ref 13.0–17.0)
Lymphocytes Relative: 28.6 % (ref 12.0–46.0)
Lymphs Abs: 2.5 10*3/uL (ref 0.7–4.0)
MCHC: 33.5 g/dL (ref 30.0–36.0)
MCV: 87.1 fl (ref 78.0–100.0)
Monocytes Absolute: 0.7 10*3/uL (ref 0.1–1.0)
Monocytes Relative: 8.6 % (ref 3.0–12.0)
Neutro Abs: 5 10*3/uL (ref 1.4–7.7)
Neutrophils Relative %: 58.4 % (ref 43.0–77.0)
Platelets: 224 10*3/uL (ref 150.0–400.0)
RBC: 4.91 Mil/uL (ref 4.22–5.81)
RDW: 13.5 % (ref 11.5–15.5)
WBC: 8.6 10*3/uL (ref 4.0–10.5)

## 2023-12-01 NOTE — Assessment & Plan Note (Signed)
 Advised to avoid foods high in saturated fats and consume sugar in moderation. Recommended lean meats and reduced intake of processed and fried foods.   Lifestyle modifications, including diet and exercise, expected to manage condition without pharmacological intervention at this time.

## 2023-12-01 NOTE — Assessment & Plan Note (Signed)
 Managed on crestor 40 mg  Last lipid panel 12/24, LDL was 85. Repeat today.

## 2023-12-01 NOTE — Assessment & Plan Note (Signed)
 Well controlled, manages with losartan 25 mg and coreg 12.5 mg bid . Ordering cmp F/u 84mo

## 2023-12-01 NOTE — Assessment & Plan Note (Signed)
 09/09/23. /p PCI.   Asa + Brillinta x 1 year. In cardiac rehab.  Htn controlled, on statin.  F/u with cardiology later this month

## 2023-12-01 NOTE — Assessment & Plan Note (Signed)
 Pt quit 12/24 but 20 pack year history. Referring for lung cancer screening w/ LDCT

## 2023-12-01 NOTE — Assessment & Plan Note (Signed)
 Denies interest in audiology referral

## 2023-12-02 ENCOUNTER — Encounter: Payer: Self-pay | Admitting: Physician Assistant

## 2023-12-03 DIAGNOSIS — Z955 Presence of coronary angioplasty implant and graft: Secondary | ICD-10-CM | POA: Diagnosis not present

## 2023-12-07 ENCOUNTER — Encounter

## 2023-12-07 DIAGNOSIS — Z955 Presence of coronary angioplasty implant and graft: Secondary | ICD-10-CM | POA: Diagnosis not present

## 2023-12-07 DIAGNOSIS — Z006 Encounter for examination for normal comparison and control in clinical research program: Secondary | ICD-10-CM

## 2023-12-07 MED ORDER — STUDY - ARTEMIS - ZILTIVEKIMAB 15 MG/0.5 ML OR PLACEBO SQ INJECTION (PI-CHRISTOPHER)
15.0000 mg | INJECTION | Freq: Once | SUBCUTANEOUS | Status: AC
Start: 2023-12-07 — End: 2023-12-07
  Administered 2023-12-07: 15 mg via SUBCUTANEOUS
  Filled 2023-12-07: qty 0.5

## 2023-12-07 MED ORDER — STUDY - ARTEMIS - ZILTIVEKIMAB 15 MG/0.5 ML OR PLACEBO SQ INJECTION (PI-CHRISTOPHER): 15.0000 mg | INJECTION | SUBCUTANEOUS | 0 refills | Status: DC

## 2023-12-07 NOTE — Research (Addendum)
  ARTEMIS V5 (Month 3 +/-3 days)  Were all eligibility criteria met to continue in study? [x] Yes [] No   Hep B DNA monitoring: [] Yes [x] No    Concomitant meds: [x] Yes [] No    Height, VS: [x] Yes [] No Subject resting for >5 mins before VS taken.    Any hospitalizations/Adverse Events identified: [x] Yes [] No Pt visited ED on 11/14/23 for prolonged bleeding in gums after extraction of 3 teeth. Bleeding has since resolved. Pt says symptom onset of index AMI started at 0600.  Subject says he quit smoking the day of his MI on 09/09/23. Subject confirmed having smoked cigarettes for 42 years prior.   Central Lab assessments drawn: [x] Yes [] No   Hs-CRP, Lipids, biochemistry/hematology, PK sampling, immunogenicity assessments   Pregnancy Test Was the sample collected? [] Yes [] No [x] N/A Was the collection date the same as the visit date? [] Yes [] No Specimen Type: [] Serum [] Urine Collection Date: Collection Time: Pregnancy Test Result: [] Positive [] Negative [] Borderline [] Invalid   Administer training of study intervention and dosing instructions and supervised self administration of study intervention during the site visit:[x] Yes [] No  Subject returned pen from kit # [id number] with box. Pen placed in sharps container and box returned to pharmacy. Subject successfully self administered study drug in R lower abdomen at 0945 without complication with pen from kit # [id number] and placed in sharps container. Subject took home 3 pens for home doses from kit # [id number] and [id number]. Instructed to return used pens with boxes to next appt. Pt verbalized understanding.   Study Drug dispensed via RTSM: [x] Yes [] No   Assess dosing and administration conditions: [x] Yes [] No Subject forgot dosing diary at this visit. Subject verbalized that no doses were missed. Will bring to next appt.   Ensure updated contact person list: [x] Yes [] No Subject verbalized consent to contact PCP about trial participation.     Current Outpatient Medications:    aspirin 81 MG chewable tablet, Chew 1 tablet (81 mg total) by mouth daily., Disp: 90 tablet, Rfl: 2   carvedilol (COREG) 12.5 MG tablet, Take 1 tablet (12.5 mg total) by mouth 2 (two) times daily with a meal., Disp: 60 tablet, Rfl: 3   losartan (COZAAR) 25 MG tablet, Take 1 tablet (25 mg total) by mouth daily., Disp: 90 tablet, Rfl: 1   nitroGLYCERIN (NITROSTAT) 0.4 MG SL tablet, Place 1 tablet (0.4 mg total) under the tongue every 5 (five) minutes x 3 doses as needed for chest pain., Disp: 25 tablet, Rfl: 2   rosuvastatin (CRESTOR) 40 MG tablet, Take 1 tablet (40 mg total) by mouth daily., Disp: 30 tablet, Rfl: 3   Study - ARTEMIS - ziltivekimab 15 mg/0.5 mL or placebo SQ injection (PI-Christopher), Inject 0.5 mLs (15 mg total) into the skin every 28 (twenty-eight) days. Investigational Study Drug. Please contact the [facility] team for any questions or concerns regarding this medication., Disp: 1.5 mL, Rfl: 0   ticagrelor (BRILINTA) 90 MG TABS tablet, Take 1 tablet (90 mg total) by mouth 2 (two) times daily., Disp: 180 tablet, Rfl: 2

## 2023-12-09 NOTE — Research (Addendum)
Are there any labs that are clinically significant?  Yes [] OR No[x]  Is the patient eligible to continue enrollment in the study after screening visit?  Yes [x]  OR No[]          

## 2023-12-10 DIAGNOSIS — Z955 Presence of coronary angioplasty implant and graft: Secondary | ICD-10-CM | POA: Diagnosis not present

## 2023-12-14 DIAGNOSIS — Z955 Presence of coronary angioplasty implant and graft: Secondary | ICD-10-CM | POA: Diagnosis not present

## 2023-12-14 NOTE — Progress Notes (Unsigned)
  Cardiology Office Note:  .   Date:  12/14/2023  ID:  Colton White, DOB November 15, 1965, MRN [id number] PCP: Alfredia Ferguson, PA-C  [facility] Providers Cardiologist:  Alexanderjames Berg [place], MD }   History of Present Illness: .   Colton White is a 58 y.o. male seen for follow up CAD.   He was admitted in Dec with a STEMI. Underwent cardiac catheterization on 09/09/2023 with two-vessel critical obstructive CAD status post successful PCI of the mid and proximal RCA tandem lesions and drug-eluting stent x 1 with successful PCI of the mid left circumflex with drug-eluting stent.  Patient had echocardiogram on 09/10/2023 which found normal LVEF of 60 to 65% with grade 1 diastolic dysfunction.    On follow up today he is doing great. No chest pain or dyspnea. Is no longer smoking. Has made major changes in diet. Tolerating medication well.    ROS: As above otherwise negative.  Studies Reviewed: .   Cath: 09/09/2023   Critical 2 vessel obstructive CAD. Moderately elevated LVEDP 26 mm Hg Successful PCI of the mid and crux RCA tandem lesions with DES x 1 Successful PCI of the mid LCx with DES   Plan: check Echo. DAPT for one year. Candidate for DC in am if stable. Smoking cessation. Optimize BP and lipid control    Diagnostic Dominance: Right  Intervention       EKG Interpretation Date/Time:    Ventricular Rate:    PR Interval:    QRS Duration:    QT Interval:    QTC Calculation:   R Axis:      Text Interpretation:      Physical Exam:   VS:  There were no vitals taken for this visit.   Wt Readings from Last 3 Encounters:  12/01/23 146 lb (66.2 kg)  09/30/23 137 lb 3.2 oz (62.2 kg)  09/22/23 136 lb 6.4 oz (61.9 kg)    GEN: Well nourished, well developed in no acute distress NECK: No JVD; No carotid bruits CARDIAC: RRR, no murmurs, rubs, gallops RESPIRATORY:  Clear to auscultation without rales, wheezing or rhonchi  ABDOMEN: Soft, non-tender,  non-distended EXTREMITIES:  No edema  ASSESSMENT AND PLAN: .    CAD: Status post STEMI with drug-eluting stent x 2 to the right coronary artery, and DES to the left circumflex.  LAD was with out stenosis.  Continue DAPT for one year. Continue Coreg and statin.         2.  Hypercholesterolemia: The patient is now on atorvastatin 40 mg daily.  Last LDL 33 excellent. Continue dietary modification  3.  Hypertension: BP with excellent control on Coreg and losartan. If BP continues to do well could consider tailoring medication in future.  4. Tobacco abuse. Congratulated on cessation.   Signed, Lakyra Tippins [place] MD, [facility]

## 2023-12-16 DIAGNOSIS — Z955 Presence of coronary angioplasty implant and graft: Secondary | ICD-10-CM | POA: Diagnosis not present

## 2023-12-17 DIAGNOSIS — Z955 Presence of coronary angioplasty implant and graft: Secondary | ICD-10-CM | POA: Diagnosis not present

## 2023-12-18 ENCOUNTER — Encounter: Payer: Self-pay | Admitting: Cardiology

## 2023-12-18 ENCOUNTER — Other Ambulatory Visit (HOSPITAL_BASED_OUTPATIENT_CLINIC_OR_DEPARTMENT_OTHER): Payer: Self-pay

## 2023-12-18 ENCOUNTER — Ambulatory Visit: Payer: Self-pay | Attending: Cardiology | Admitting: Cardiology

## 2023-12-18 VITALS — BP 110/66 | HR 67 | Ht 67.0 in | Wt 147.6 lb

## 2023-12-18 DIAGNOSIS — I2510 Atherosclerotic heart disease of native coronary artery without angina pectoris: Secondary | ICD-10-CM | POA: Diagnosis not present

## 2023-12-18 DIAGNOSIS — F17201 Nicotine dependence, unspecified, in remission: Secondary | ICD-10-CM

## 2023-12-18 DIAGNOSIS — I10 Essential (primary) hypertension: Secondary | ICD-10-CM

## 2023-12-18 DIAGNOSIS — E7800 Pure hypercholesterolemia, unspecified: Secondary | ICD-10-CM | POA: Diagnosis not present

## 2023-12-18 MED ORDER — ROSUVASTATIN CALCIUM 40 MG PO TABS
40.0000 mg | ORAL_TABLET | Freq: Every day | ORAL | 3 refills | Status: AC
  Filled 2023-12-18 – 2023-12-31 (×2): qty 90, 90d supply, fill #0
  Filled 2024-02-28 – 2024-03-28 (×2): qty 90, 90d supply, fill #1
  Filled 2024-06-27: qty 90, 90d supply, fill #2
  Filled 2024-08-24 – 2024-09-22 (×2): qty 90, 90d supply, fill #3

## 2023-12-18 MED ORDER — LOSARTAN POTASSIUM 25 MG PO TABS
25.0000 mg | ORAL_TABLET | Freq: Every day | ORAL | 3 refills | Status: AC
  Filled 2023-12-18 – 2023-12-31 (×2): qty 90, 90d supply, fill #0
  Filled 2024-02-28 – 2024-03-28 (×2): qty 90, 90d supply, fill #1
  Filled 2024-06-27: qty 90, 90d supply, fill #2
  Filled 2024-08-24 – 2024-09-22 (×2): qty 90, 90d supply, fill #3

## 2023-12-18 MED ORDER — NITROGLYCERIN 0.4 MG SL SUBL
0.4000 mg | SUBLINGUAL_TABLET | SUBLINGUAL | 11 refills | Status: AC | PRN
  Filled 2023-12-18: qty 25, 1d supply, fill #0
  Filled 2023-12-31: qty 25, 7d supply, fill #0
  Filled 2024-02-28: qty 25, 7d supply, fill #1
  Filled 2024-04-20: qty 25, 7d supply, fill #2
  Filled 2024-06-27: qty 25, 7d supply, fill #3
  Filled 2024-08-24: qty 25, 8d supply, fill #4
  Filled 2024-09-22: qty 25, 8d supply, fill #5

## 2023-12-18 MED ORDER — CARVEDILOL 12.5 MG PO TABS
12.5000 mg | ORAL_TABLET | Freq: Two times a day (BID) | ORAL | 3 refills | Status: DC
  Filled 2023-12-18 – 2023-12-31 (×2): qty 180, 90d supply, fill #0
  Filled 2024-02-28 – 2024-03-28 (×2): qty 180, 90d supply, fill #1
  Filled 2024-06-27: qty 180, 90d supply, fill #2

## 2023-12-18 MED ORDER — TICAGRELOR 90 MG PO TABS
90.0000 mg | ORAL_TABLET | Freq: Two times a day (BID) | ORAL | 3 refills | Status: AC
  Filled 2023-12-18 – 2023-12-31 (×2): qty 180, 90d supply, fill #0
  Filled 2024-02-28 – 2024-03-28 (×2): qty 180, 90d supply, fill #1
  Filled 2024-06-27: qty 180, 90d supply, fill #2

## 2023-12-18 NOTE — Patient Instructions (Signed)
 Medication Instructions:  Continue same medications *If you need a refill on your cardiac medications before your next appointment, please call your pharmacy*  Lab Work: None ordered  Testing/Procedures: None ordered  Follow-Up: At [facility], you and your health needs are our priority.  As part of our continuing mission to provide you with exceptional heart care, our providers are all part of one team.  This team includes your primary Cardiologist (physician) and Advanced Practice Providers or APPs (Physician Assistants and Nurse Practitioners) who all work together to provide you with the care you need, when you need it.  Your next appointment:  6 months   Call in June to schedule Sept appointment     Provider:  Dr.Jordan   We recommend signing up for the patient portal called "MyChart".  Sign up information is provided on this After Visit Summary.  MyChart is used to connect with patients for Virtual Visits (Telemedicine).  Patients are able to view lab/test results, encounter notes, upcoming appointments, etc.  Non-urgent messages can be sent to your provider as well.   To learn more about what you can do with MyChart, go to [email or web address].      1st Floor: - Lobby - Registration  - Pharmacy  - Lab - Cafe  2nd Floor: - PV Lab - Diagnostic Testing (echo, CT, nuclear med)  3rd Floor: - Vacant  4th Floor: - TCTS (cardiothoracic surgery) - AFib Clinic - Structural Heart Clinic - Vascular Surgery  - Vascular Ultrasound  5th Floor: - HeartCare Cardiology (general and EP) - Clinical Pharmacy for coumadin, hypertension, lipid, weight-loss medications, and med management appointments    Valet parking services will be available as well.

## 2023-12-18 NOTE — Addendum Note (Signed)
 Addended by: Neoma Laming on: 12/18/2023 02:01 PM   Modules accepted: Orders

## 2023-12-21 DIAGNOSIS — Z955 Presence of coronary angioplasty implant and graft: Secondary | ICD-10-CM | POA: Diagnosis not present

## 2023-12-31 ENCOUNTER — Other Ambulatory Visit: Payer: Self-pay

## 2023-12-31 ENCOUNTER — Other Ambulatory Visit (HOSPITAL_BASED_OUTPATIENT_CLINIC_OR_DEPARTMENT_OTHER): Payer: Self-pay

## 2024-02-01 ENCOUNTER — Other Ambulatory Visit: Payer: Self-pay

## 2024-02-29 ENCOUNTER — Other Ambulatory Visit (HOSPITAL_BASED_OUTPATIENT_CLINIC_OR_DEPARTMENT_OTHER): Payer: Self-pay

## 2024-03-03 ENCOUNTER — Other Ambulatory Visit (HOSPITAL_BASED_OUTPATIENT_CLINIC_OR_DEPARTMENT_OTHER): Payer: Self-pay

## 2024-03-08 ENCOUNTER — Encounter

## 2024-03-08 VITALS — BP 97/57 | HR 70

## 2024-03-08 DIAGNOSIS — Z006 Encounter for examination for normal comparison and control in clinical research program: Secondary | ICD-10-CM

## 2024-03-08 MED ORDER — STUDY - ARTEMIS - ZILTIVEKIMAB 15 MG/0.5 ML OR PLACEBO SQ INJECTION (PI-CHRISTOPHER): 15.0000 mg | INJECTION | SUBCUTANEOUS | 0 refills | Status: DC

## 2024-03-08 NOTE — Research (Signed)
  ARTEMIS V6 (Month 6 +/-7 days)  Were all eligibility criteria met to continue in study? [x] Yes [] No   Hep B DNA monitoring: [] Yes [x] No    Concomitant meds: [x] Yes [] No    VS: [x] Yes [] No Subject resting for >5 mins before VS taken.    Any hospitalizations/Adverse Events identified: [] Yes [x] No   Echocardiography data: [] Yes [x] No   EQ-5D-5L Assessment Completed? [x] Yes [] No Reason not done: [] Subject Forgot [] Subject too ill [] Subject refused [] Technical failure [] Other If Other, Specify Collection Date: 17/Jun/2025   Central Lab assessments drawn: [x] Yes [] No   Hs-CRP, biochemistry/hematology, PK sampling, immunogenicity assessments   Pregnancy Test Was the sample collected? [] Yes [] No [x] N/A Was the collection date the same as the visit date? [] Yes [] No Specimen Type: [] Serum [] Urine Collection Date: Collection Time: Pregnancy Test Result: [] Positive [] Negative [] Borderline [] Invalid   Study Drug dispensed via RTSM: [x] Yes [] No Subject returned 1 pen with box from kit # [id number] and 1 pen from kit # [id number] without box. Pens placed in sharps container and box returned to pharmacy. Subject left unused pen from kit [id number] at home and will bring to next appt. Subject took home 4 pens for home doses from kit # [id number] and [id number]. Instructed to return used pens with boxes to next appt. Pt verbalized understanding.   Assess dosing and administration conditions: Yes [x]  No[]  Instructed patient to dose on same day every month to avoid doses less than 28 days apart. DFUs used for reference. Patient verbalized dosing every month on the 18th using alternating sites on lower abdomen without missing any doses. Denies any complications with dosing. Patient did not present with documentation of home doses. Subject given dosing diary and re educated to document home doses to have on file. Patient and wife verbalized understanding.    Ensure updated contact person list: [x] Yes  [] No   Dosing diary updated: [x] Yes [] No  Subject given new dosing diary to record home doses and verbalized to bring back to each appt.    Current Outpatient Medications:    aspirin  81 MG chewable tablet, Chew 1 tablet (81 mg total) by mouth daily., Disp: 90 tablet, Rfl: 2   carvedilol  (COREG ) 12.5 MG tablet, Take 1 tablet (12.5 mg total) by mouth 2 (two) times daily with a meal., Disp: 180 tablet, Rfl: 3   losartan  (COZAAR ) 25 MG tablet, Take 1 tablet (25 mg total) by mouth daily., Disp: 90 tablet, Rfl: 3   nitroGLYCERIN  (NITROSTAT ) 0.4 MG SL tablet, Place 1 tablet (0.4 mg total) under the tongue every 5 (five) minutes x 3 doses as needed for chest pain., Disp: 25 tablet, Rfl: 11   rosuvastatin  (CRESTOR ) 40 MG tablet, Take 1 tablet (40 mg total) by mouth daily., Disp: 90 tablet, Rfl: 3   ticagrelor  (BRILINTA ) 90 MG TABS tablet, Take 1 tablet (90 mg total) by mouth 2 (two) times daily., Disp: 180 tablet, Rfl: 3   Study - ARTEMIS - ziltivekimab 15 mg/0.5 mL or placebo SQ injection (PI-Christopher), Inject 0.5 mLs (15 mg total) into the skin every 28 (twenty-eight) days. Investigational Study Drug. Please contact the [facility] team for any questions or concerns regarding this medication., Disp: 2 mL, Rfl: 0

## 2024-03-11 NOTE — Research (Addendum)
Are there any labs that are clinically significant?  Yes [] OR No[x]  Is the patient eligible to continue enrollment in the study after screening visit?  Yes [x]  OR No[]          

## 2024-03-28 ENCOUNTER — Other Ambulatory Visit (HOSPITAL_BASED_OUTPATIENT_CLINIC_OR_DEPARTMENT_OTHER): Payer: Self-pay

## 2024-03-29 ENCOUNTER — Other Ambulatory Visit (HOSPITAL_BASED_OUTPATIENT_CLINIC_OR_DEPARTMENT_OTHER): Payer: Self-pay

## 2024-04-29 ENCOUNTER — Other Ambulatory Visit (HOSPITAL_BASED_OUTPATIENT_CLINIC_OR_DEPARTMENT_OTHER): Payer: Self-pay

## 2024-05-30 ENCOUNTER — Other Ambulatory Visit: Payer: Self-pay | Admitting: Cardiology

## 2024-05-30 ENCOUNTER — Other Ambulatory Visit: Payer: Self-pay

## 2024-06-01 ENCOUNTER — Other Ambulatory Visit (HOSPITAL_BASED_OUTPATIENT_CLINIC_OR_DEPARTMENT_OTHER): Payer: Self-pay

## 2024-06-01 MED ORDER — ASPIRIN 81 MG PO CHEW
81.0000 mg | CHEWABLE_TABLET | Freq: Every day | ORAL | 1 refills | Status: AC
  Filled 2024-06-01: qty 90, 90d supply, fill #0
  Filled 2024-06-27 – 2024-08-24 (×2): qty 90, 90d supply, fill #1

## 2024-06-08 ENCOUNTER — Encounter

## 2024-06-08 DIAGNOSIS — Z006 Encounter for examination for normal comparison and control in clinical research program: Secondary | ICD-10-CM

## 2024-06-08 MED ORDER — STUDY - ARTEMIS - ZILTIVEKIMAB 15 MG/0.5 ML OR PLACEBO SQ INJECTION (PI-CHRISTOPHER): 15.0000 mg | INJECTION | SUBCUTANEOUS | 0 refills | Status: AC

## 2024-06-08 NOTE — Research (Signed)
 ARTEMIS V7 (Month 9 +/-7 days)  Were all eligibility criteria met to continue in study? [x] Yes [] No   Hep B DNA monitoring: [] Yes [x] No    Concomitant meds: [x] Yes [] No    VS: [x] Yes [] No Subject resting for >5 mins before 3 sets of VS taken.    Any hospitalizations/Adverse Events/Infections identified: [] Yes [x] No   Central Lab assessments drawn: [x] Yes [] No   biochemistry/hematology  Pregnancy Test Was the sample collected? [] Yes [] No [x] N/A Was the collection date the same as the visit date? [] Yes [] No Specimen Type: [] Serum [] Urine Collection Date: Collection Time: Pregnancy Test Result: [] Positive [] Negative [] Borderline [] Invalid   Study Drug dispensed via [facility]: [x] Yes [] No Subject returned 2 used pens from kit # [id number] with box and 1 used pen from kit # [id number] without box, threw away at home. Pens placed in sharps container and box returned to pharmacy. Pens from kit # [id number] were unused and left at home. Reeducated subject to bring back all used/unused pens with boxes to appts to avoid dosing with expired pens. Subject verbalized understanding. Subject took home 4 pens for home doses from kit # [id number] and [phone]. Instructed to use these pens for home doses and return used pens with boxes to next appt. Pt verbalized understanding.    Assess dosing and administration conditions: Yes [x]  No[]  Instructed patient to dose on same day every month to avoid doses less than 28 days apart. DFUs used for reference. Confirmed patient is alternating sites on abdomen. Reeducated to write R/L besides dose sites in diary. Pt verbalized understanding. Denies any complications with injections.    Ensure updated contact person list: [x] Yes [] No   Dosing diary updated: [x] Yes [] No   New Subject ID card given: [x] Yes [] No     Current Outpatient Medications:    aspirin  (ASPIRIN  LOW DOSE) 81 MG chewable tablet, Chew 1 tablet (81 mg total) by mouth daily., Disp: 90 tablet, Rfl:  1   carvedilol  (COREG ) 12.5 MG tablet, Take 1 tablet (12.5 mg total) by mouth 2 (two) times daily with a meal., Disp: 180 tablet, Rfl: 3   losartan  (COZAAR ) 25 MG tablet, Take 1 tablet (25 mg total) by mouth daily., Disp: 90 tablet, Rfl: 3   nitroGLYCERIN  (NITROSTAT ) 0.4 MG SL tablet, Place 1 tablet (0.4 mg total) under the tongue every 5 (five) minutes x 3 doses as needed for chest pain., Disp: 25 tablet, Rfl: 11   rosuvastatin  (CRESTOR ) 40 MG tablet, Take 1 tablet (40 mg total) by mouth daily., Disp: 90 tablet, Rfl: 3   Study - ARTEMIS - ziltivekimab 15 mg/0.5 mL or placebo SQ injection (PI-Christopher), Inject 0.5 mLs (15 mg total) into the skin every 28 (twenty-eight) days. Investigational Study Drug. Please contact the [facility] team for any questions or concerns regarding this medication., Disp: 2 mL, Rfl: 0   ticagrelor  (BRILINTA ) 90 MG TABS tablet, Take 1 tablet (90 mg total) by mouth 2 (two) times daily., Disp: 180 tablet, Rfl: 3

## 2024-06-10 NOTE — Research (Addendum)
 Are there any labs that are clinically significant?  Yes []  OR No[x]   Is the patient eligible to continue enrollment in the study after screening visit?  Yes [x]   OR No[] 

## 2024-06-13 NOTE — Assessment & Plan Note (Addendum)
 09/09/23 PCI. Follows with Cardiology  On low dose ASA + Brillinta  Htn controlled, on statin.  Currently in research study for ARTIEMIS (ziltivekimab or placebo).

## 2024-06-13 NOTE — Assessment & Plan Note (Signed)
 Well controlled, no changes to meds. Encouraged heart healthy diet such as the DASH diet and exercise as tolerated.

## 2024-06-13 NOTE — Assessment & Plan Note (Signed)
 Tolerating statin.  Goal LDL>70.  Update lipid panel. Encourage heart healthy diet such as MIND or DASH diet, increase exercise, avoid trans fats, simple carbohydrates and processed foods, consider a krill or fish or flaxseed oil cap daily.

## 2024-06-13 NOTE — Assessment & Plan Note (Signed)
 hgba1c acceptable, minimize simple carbs. Increase exercise as tolerated. Continue current meds

## 2024-06-13 NOTE — Progress Notes (Unsigned)
 Subjective:     Patient ID: Colton White, male    DOB: [birth year], 58 y.o.   MRN: [id number]  No chief complaint on file.   HPI  Discussed the use of AI scribe software for clinical note transcription with the patient, who gave verbal consent to proceed.  History of Present Illness         Patient presents for follow-up and capital TOC.     HTN Losartan  25 mg daily; carvedilol  12.5 mg twice daily  Hx of MI -follows with cardiology; Dr. Peter Swaziland MD. On on low dose ASA and Brilinta  09/09/23- STEMI, Hx of DVT. Underwent cardiac cath w/ successful PCI. patient is currently in a research study for ARTIEMIS (ziltivekimab or placebo).   HLD  On rosuvastatin  40 mg daily,goal of LDL less than 70.   Tobacco Use Has 20 pack year Hx, currently denies smoking  HCM: - Colonoscopy: due, referral placed -LDCT: 20 year smoking Hx, referral placed - Immunizations: Influenza, Covid-19, and PNA due      Patient denies fever, chills, SOB, CP, palpitations, dyspnea, edema, HA, vision changes, N/V/D, abdominal pain, urinary symptoms, rash, weight changes, and recent illness or hospitalizations.    Health Maintenance Due  Topic Date Due   Pneumococcal Vaccine: 50+ Years (1 of 2 - PCV) Never done   Hepatitis B Vaccines 19-59 Average Risk (1 of 3 - 19+ 3-dose series) Never done   Colonoscopy  Never done   Lung Cancer Screening  Never done   Influenza Vaccine  Never done   COVID-19 Vaccine (1 - 2024-25 season) Never done    Past Medical History:  Diagnosis Date   DVT (deep venous thrombosis) (HCC) 2007   Hearing deficit 05/31/2013   Hyperlipidemia 09/09/2023   Hypertension 09/09/2023   MI (myocardial infarction) (HCC) 11/01/2011   pt presented to ED with chest pain, ACS work up negative    Past Surgical History:  Procedure Laterality Date   BACK SURGERY     CORONARY/GRAFT ACUTE MI REVASCULARIZATION N/A 09/09/2023   Procedure: Coronary/Graft Acute MI  Revascularization;  Surgeon: Swaziland, Peter M, MD;  Location: [facility] INVASIVE CV LAB;  Service: Cardiovascular;  Laterality: N/A;    Family History  Problem Relation Age of Onset   Cancer Father     Social History   Socioeconomic History   Marital status: Married    Spouse name: Not on file   Number of children: Not on file   Years of education: Not on file   Highest education level: Not on file  Occupational History   Not on file  Tobacco Use   Smoking status: Former    Current packs/day: 0.00    Average packs/day: 0.5 packs/day for 40.0 years (20.0 ttl pk-yrs)    Types: Cigarettes    Start date: 52    Quit date: 2024    Years since quitting: 1.7   Smokeless tobacco: Never   Tobacco comments:    09/09/2023 patient stopped smoking  Vaping Use   Vaping status: Never Used  Substance and Sexual Activity   Alcohol use: No   Drug use: No   Sexual activity: Yes    Partners: Female  Other Topics Concern   Not on file  Social History Narrative   Not on file   Social Drivers of Health   Financial Resource Strain: Medium Risk (09/24/2023)   Overall Financial Resource Strain (CARDIA)    Difficulty of Paying Living Expenses: Somewhat hard  Food Insecurity: Food  Insecurity Present (09/24/2023)   Hunger Vital Sign    Worried About Running Out of Food in the Last Year: Sometimes true    Ran Out of Food in the Last Year: Never true  Transportation Needs: No Transportation Needs (09/24/2023)   PRAPARE - Administrator, Civil Service (Medical): No    Lack of Transportation (Non-Medical): No  Physical Activity: Not on file  Stress: Not on file  Social Connections: Not on file  Intimate Partner Violence: Not At Risk (09/09/2023)   Humiliation, Afraid, Rape, and Kick questionnaire    Fear of Current or Ex-Partner: No    Emotionally Abused: No    Physically Abused: No    Sexually Abused: No    Outpatient Medications Prior to Visit  Medication Sig Dispense Refill    aspirin  (ASPIRIN  LOW DOSE) 81 MG chewable tablet Chew 1 tablet (81 mg total) by mouth daily. 90 tablet 1   carvedilol  (COREG ) 12.5 MG tablet Take 1 tablet (12.5 mg total) by mouth 2 (two) times daily with a meal. 180 tablet 3   losartan  (COZAAR ) 25 MG tablet Take 1 tablet (25 mg total) by mouth daily. 90 tablet 3   nitroGLYCERIN  (NITROSTAT ) 0.4 MG SL tablet Place 1 tablet (0.4 mg total) under the tongue every 5 (five) minutes x 3 doses as needed for chest pain. 25 tablet 11   rosuvastatin  (CRESTOR ) 40 MG tablet Take 1 tablet (40 mg total) by mouth daily. 90 tablet 3   Study - ARTEMIS - ziltivekimab 15 mg/0.5 mL or placebo SQ injection (PI-Christopher) Inject 0.5 mLs (15 mg total) into the skin every 28 (twenty-eight) days. Investigational Study Drug. Please contact the [facility] team for any questions or concerns regarding this medication. 2 mL 0   ticagrelor  (BRILINTA ) 90 MG TABS tablet Take 1 tablet (90 mg total) by mouth 2 (two) times daily. 180 tablet 3   No facility-administered medications prior to visit.    No Known Allergies  ROS     Objective:    Physical Exam   There were no vitals taken for this visit. Wt Readings from Last 3 Encounters:  12/18/23 147 lb 9.6 oz (67 kg)  12/01/23 146 lb (66.2 kg)  09/30/23 137 lb 3.2 oz (62.2 kg)       Assessment & Plan:   Problem List Items Addressed This Visit     History of MI (myocardial infarction)   09/09/23 PCI. Follows with Cardiology  On low dose ASA + Brillinta  Htn controlled, on statin.  Currently in research study for ARTIEMIS (ziltivekimab or placebo).          Hyperlipidemia   Tolerating statin.  Goal LDL>70.  Update lipid panel. Encourage heart healthy diet such as MIND or DASH diet, increase exercise, avoid trans fats, simple carbohydrates and processed foods, consider a krill or fish or flaxseed oil cap daily.        Hypertension   Well controlled, no changes to meds. Encouraged heart healthy diet  such as the DASH diet and exercise as tolerated.        Prediabetes   hgba1c acceptable, minimize simple carbs. Increase exercise as tolerated. Continue current meds.      Other Visit Diagnoses       Annual visit for general adult medical examination without abnormal findings    -  Primary     Hx of tobacco use, presenting hazards to health  HCM: - Colonoscopy: due, referral placed -LDCT: 20 year smoking Hx, referral placed - Immunizations: Influenza, Covid-19, and PNA due  FU 6 months  I am having Winnie M. Froh maintain his ticagrelor , rosuvastatin , nitroGLYCERIN , losartan , carvedilol , aspirin , and ARTEMIS ziltivekimab or placebo.  No orders of the defined types were placed in this encounter.

## 2024-06-15 ENCOUNTER — Encounter: Payer: Self-pay | Admitting: Student

## 2024-06-15 ENCOUNTER — Ambulatory Visit: Admitting: Student

## 2024-06-15 VITALS — BP 130/80 | HR 64 | Temp 98.0°F | Ht 67.0 in | Wt 159.0 lb

## 2024-06-15 DIAGNOSIS — R7303 Prediabetes: Secondary | ICD-10-CM

## 2024-06-15 DIAGNOSIS — E785 Hyperlipidemia, unspecified: Secondary | ICD-10-CM

## 2024-06-15 DIAGNOSIS — G4700 Insomnia, unspecified: Secondary | ICD-10-CM

## 2024-06-15 DIAGNOSIS — I252 Old myocardial infarction: Secondary | ICD-10-CM

## 2024-06-15 DIAGNOSIS — Z0000 Encounter for general adult medical examination without abnormal findings: Secondary | ICD-10-CM | POA: Insufficient documentation

## 2024-06-15 DIAGNOSIS — Z87891 Personal history of nicotine dependence: Secondary | ICD-10-CM | POA: Diagnosis not present

## 2024-06-15 DIAGNOSIS — I10 Essential (primary) hypertension: Secondary | ICD-10-CM | POA: Diagnosis not present

## 2024-06-15 NOTE — Assessment & Plan Note (Signed)
 Patient encouraged to maintain heart healthy diet, regular exercise, adequate sleep. Consider daily probiotics. Take medications as prescribed.   Routine visit. Discussed health maintenance, diet, exercise, sleep. Active lifestyle, healthy diet, sleep disturbances noted but feels well-rested. - Recommend follow-up for sleep issues if problematic. - Discussed importance of regular eye exams, dental care

## 2024-06-16 ENCOUNTER — Ambulatory Visit: Payer: Self-pay | Admitting: Student

## 2024-06-16 LAB — CBC WITH DIFFERENTIAL/PLATELET
Basophils Absolute: 0.1 K/uL (ref 0.0–0.1)
Basophils Relative: 0.8 % (ref 0.0–3.0)
Eosinophils Absolute: 0.2 K/uL (ref 0.0–0.7)
Eosinophils Relative: 2.3 % (ref 0.0–5.0)
HCT: 44.1 % (ref 39.0–52.0)
Hemoglobin: 14.9 g/dL (ref 13.0–17.0)
Lymphocytes Relative: 30.2 % (ref 12.0–46.0)
Lymphs Abs: 2.8 K/uL (ref 0.7–4.0)
MCHC: 33.7 g/dL (ref 30.0–36.0)
MCV: 82.9 fl (ref 78.0–100.0)
Monocytes Absolute: 0.6 K/uL (ref 0.1–1.0)
Monocytes Relative: 6.5 % (ref 3.0–12.0)
Neutro Abs: 5.6 K/uL (ref 1.4–7.7)
Neutrophils Relative %: 60.2 % (ref 43.0–77.0)
Platelets: 193 K/uL (ref 150.0–400.0)
RBC: 5.32 Mil/uL (ref 4.22–5.81)
RDW: 13.4 % (ref 11.5–15.5)
WBC: 9.3 K/uL (ref 4.0–10.5)

## 2024-06-16 LAB — COMPREHENSIVE METABOLIC PANEL WITH GFR
ALT: 38 U/L (ref 0–53)
AST: 30 U/L (ref 0–37)
Albumin: 4.5 g/dL (ref 3.5–5.2)
Alkaline Phosphatase: 50 U/L (ref 39–117)
BUN: 18 mg/dL (ref 6–23)
CO2: 24 meq/L (ref 19–32)
Calcium: 9.6 mg/dL (ref 8.4–10.5)
Chloride: 105 meq/L (ref 96–112)
Creatinine, Ser: 1.08 mg/dL (ref 0.40–1.50)
GFR: 75.98 mL/min (ref >60.00)
Glucose, Bld: 118 mg/dL — ABNORMAL HIGH (ref 70–99)
Potassium: 4 meq/L (ref 3.5–5.1)
Sodium: 143 meq/L (ref 135–145)
Total Bilirubin: 0.6 mg/dL (ref 0.2–1.2)
Total Protein: 6.9 g/dL (ref 6.0–8.3)

## 2024-06-16 LAB — LIPID PANEL
Cholesterol: 101 mg/dL (ref 0–200)
HDL: 40.7 mg/dL (ref >39.00)
LDL Cholesterol: 33 mg/dL (ref 0–99)
NonHDL: 60.01
Total CHOL/HDL Ratio: 2
Triglycerides: 133 mg/dL (ref 0.0–149.0)
VLDL: 26.6 mg/dL (ref 0.0–40.0)

## 2024-06-16 LAB — TSH: TSH: 4.59 u[IU]/mL (ref 0.35–5.50)

## 2024-06-16 LAB — HEMOGLOBIN A1C: Hgb A1c MFr Bld: 6.6 % — ABNORMAL HIGH (ref 4.6–6.5)

## 2024-06-26 NOTE — Progress Notes (Unsigned)
  Cardiology Office Note:  .   Date:  06/26/2024  ID:  Colton White, DOB [birth year], MRN [id number] PCP: Wheeler Harlene CROME, NP  [facility] Providers Cardiologist:  Shaniya Tashiro [place], MD }   History of Present Illness: .   Colton White is a 58 y.o. male seen for follow up CAD.   He was admitted in Dec with a STEMI. Underwent cardiac catheterization on 09/09/2023 with two-vessel critical obstructive CAD status post successful PCI of the mid and proximal RCA tandem lesions and drug-eluting stent x 1 with successful PCI of the mid left circumflex with drug-eluting stent.  Patient had echocardiogram on 09/10/2023 which found normal LVEF of 60 to 65% with grade 1 diastolic dysfunction.    On follow up today he is doing great. No chest pain or dyspnea. Is no longer smoking. Has made major changes in diet. Tolerating medication well.    ROS: As above otherwise negative.  Studies Reviewed: .   Cath: 09/09/2023   Critical 2 vessel obstructive CAD. Moderately elevated LVEDP 26 mm Hg Successful PCI of the mid and crux RCA tandem lesions with DES x 1 Successful PCI of the mid LCx with DES   Plan: check Echo. DAPT for one year. Candidate for DC in am if stable. Smoking cessation. Optimize BP and lipid control    Diagnostic Dominance: Right  Intervention       EKG Interpretation Date/Time:    Ventricular Rate:    PR Interval:    QRS Duration:    QT Interval:    QTC Calculation:   R Axis:      Text Interpretation:      Physical Exam:   VS:  There were no vitals taken for this visit.   Wt Readings from Last 3 Encounters:  06/15/24 159 lb (72.1 kg)  12/18/23 147 lb 9.6 oz (67 kg)  12/01/23 146 lb (66.2 kg)    GEN: Well nourished, well developed in no acute distress NECK: No JVD; No carotid bruits CARDIAC: RRR, no murmurs, rubs, gallops RESPIRATORY:  Clear to auscultation without rales, wheezing or rhonchi  ABDOMEN: Soft, non-tender, non-distended EXTREMITIES:  No  edema  ASSESSMENT AND PLAN: .    CAD: Status post STEMI with drug-eluting stent x 2 to the right coronary artery, and DES to the left circumflex.  LAD was with out stenosis.  Continue DAPT for one year. Continue Coreg  and statin.         2.  Hypercholesterolemia: The patient is now on atorvastatin 40 mg daily.  Last LDL 33 excellent. Continue dietary modification  3.  Hypertension: BP with excellent control on Coreg  and losartan . If BP continues to do well could consider tailoring medication in future.  4. Tobacco abuse. Congratulated on cessation.   Signed, Faizon Capozzi [place] MD, FACC

## 2024-06-27 ENCOUNTER — Other Ambulatory Visit: Payer: Self-pay

## 2024-06-27 ENCOUNTER — Other Ambulatory Visit (HOSPITAL_BASED_OUTPATIENT_CLINIC_OR_DEPARTMENT_OTHER): Payer: Self-pay

## 2024-06-27 ENCOUNTER — Encounter: Payer: Self-pay | Admitting: Cardiology

## 2024-06-27 ENCOUNTER — Ambulatory Visit: Attending: Cardiology | Admitting: Cardiology

## 2024-06-27 VITALS — BP 102/60 | HR 65 | Resp 16 | Ht 67.0 in | Wt 162.6 lb

## 2024-06-27 DIAGNOSIS — E7800 Pure hypercholesterolemia, unspecified: Secondary | ICD-10-CM | POA: Diagnosis present

## 2024-06-27 DIAGNOSIS — I10 Essential (primary) hypertension: Secondary | ICD-10-CM | POA: Diagnosis present

## 2024-06-27 DIAGNOSIS — I2510 Atherosclerotic heart disease of native coronary artery without angina pectoris: Secondary | ICD-10-CM | POA: Diagnosis present

## 2024-06-27 MED ORDER — SILDENAFIL CITRATE 20 MG PO TABS
20.0000 mg | ORAL_TABLET | ORAL | 3 refills | Status: AC | PRN
  Filled 2024-06-27: qty 50, 30d supply, fill #0
  Filled 2024-08-24: qty 50, 30d supply, fill #1

## 2024-06-27 NOTE — Patient Instructions (Addendum)
 Medication Instructions:  Stop Carvedilol   Start Sildenafil 20 mg  Take 2 to 5 tablets daily as needed Stop Brilinta  09/08/24 Continue all other medications  *If you need a refill on your cardiac medications before your next appointment, please call your pharmacy*  Lab Work: None ordered   Testing/Procedures: None ordered  Follow-Up: At [facility], you and your health needs are our priority.  As part of our continuing mission to provide you with exceptional heart care, our providers are all part of one team.  This team includes your primary Cardiologist (physician) and Advanced Practice Providers or APPs (Physician Assistants and Nurse Practitioners) who all work together to provide you with the care you need, when you need it.  Your next appointment:  1 year   Call in May to schedule Oct appointment     Provider:  Dr.Jordan   We recommend signing up for the patient portal called MyChart.  Sign up information is provided on this After Visit Summary.  MyChart is used to connect with patients for Virtual Visits (Telemedicine).  Patients are able to view lab/test results, encounter notes, upcoming appointments, etc.  Non-urgent messages can be sent to your provider as well.   To learn more about what you can do with MyChart, go to [email or web address].

## 2024-07-28 ENCOUNTER — Encounter: Payer: Self-pay | Admitting: Emergency Medicine

## 2024-07-28 ENCOUNTER — Encounter: Payer: Self-pay | Admitting: Cardiology

## 2024-07-28 ENCOUNTER — Ambulatory Visit
Admission: EM | Admit: 2024-07-28 | Discharge: 2024-07-28 | Disposition: A | Discharge status: Home / Self Care | Attending: Family Medicine | Admitting: Family Medicine

## 2024-07-28 DIAGNOSIS — J029 Acute pharyngitis, unspecified: Secondary | ICD-10-CM | POA: Insufficient documentation

## 2024-07-28 DIAGNOSIS — N61 Mastitis without abscess: Secondary | ICD-10-CM | POA: Diagnosis not present

## 2024-07-28 LAB — POCT RAPID STREP A (OFFICE): Rapid Strep A Screen: NEGATIVE

## 2024-07-28 MED ORDER — MUPIROCIN 2 % EX OINT: 1.0000 | TOPICAL_OINTMENT | Freq: Two times a day (BID) | CUTANEOUS | 0 refills | Status: AC

## 2024-07-28 NOTE — ED Provider Notes (Signed)
 [facility]    CSN: [id number] Arrival date & time: 07/28/24  1904      History   Chief Complaint Chief Complaint  Patient presents with   Sore Throat   Breast Problem    HPI Colton White is a 58 y.o. male  presents for evaluation of URI symptoms for 2 days. Patient reports associated symptoms of sore throat. Denies N/V/D, cough, congestion, fevers, ear pain, body aches, shortness of breath. Patient does not have a hx of asthma. Patient is a previous smoker that quit earlier this year after his heart attack.  Reports no known sick contacts.  Pt has taken to [facility] for symptoms.  In addition patient has had some right nipple irritation for about a week.  They googled side effects of his cholesterol medication and had concerned that he is developing gynecomastia.  He denies any drainage or swelling but states the nipple itself is very sore and any friction causes him pain.  They tried some bacitracin but no dressing. Pt has no other concerns at this time.    Sore Throat    Past Medical History:  Diagnosis Date   DVT (deep venous thrombosis) (HCC) 2007   Hearing deficit 05/31/2013   Hyperlipidemia 09/09/2023   Hypertension 09/09/2023   MI (myocardial infarction) (HCC) 11/01/2011   pt presented to ED with chest pain, ACS work up negative    Patient Active Problem List   Diagnosis Date Noted   Annual visit for general adult medical examination without abnormal findings 06/15/2024   History of MI (myocardial infarction) 12/01/2023   Tobacco use 12/01/2023   Bilateral hearing loss 12/01/2023   Prediabetes 12/01/2023   Hyperlipidemia 09/11/2023   Hypertension 09/11/2023   ST elevation myocardial infarction involving left circumflex coronary artery (HCC) 09/09/2023   STEMI involving left circumflex coronary artery (HCC) 09/09/2023    Past Surgical History:  Procedure Laterality Date   BACK SURGERY     CORONARY/GRAFT ACUTE MI REVASCULARIZATION N/A  09/09/2023   Procedure: Coronary/Graft Acute MI Revascularization;  Surgeon: Jordan, Peter M, MD;  Location: [facility] INVASIVE CV LAB;  Service: Cardiovascular;  Laterality: N/A;       Home Medications    Prior to Admission medications   Medication Sig Start Date End Date Taking? Authorizing Provider  mupirocin ointment (BACTROBAN) 2 % Apply 1 Application topically 2 (two) times daily for 5 days. 07/28/24 08/02/24 Yes Nasya Vincent, Jodi R, NP  aspirin  (ASPIRIN  LOW DOSE) 81 MG chewable tablet Chew 1 tablet (81 mg total) by mouth daily. 06/01/24   Jordan, Peter M, MD  losartan  (COZAAR ) 25 MG tablet Take 1 tablet (25 mg total) by mouth daily. 12/18/23   Jordan, Peter M, MD  nitroGLYCERIN  (NITROSTAT ) 0.4 MG SL tablet Place 1 tablet (0.4 mg total) under the tongue every 5 (five) minutes x 3 doses as needed for chest pain. 12/18/23   Jordan, Peter M, MD  rosuvastatin  (CRESTOR ) 40 MG tablet Take 1 tablet (40 mg total) by mouth daily. 12/18/23   Jordan, Peter M, MD  sildenafil (REVATIO) 20 MG tablet Take 1 tablet (20 mg total) by mouth as needed. Take 2- 5 tablets one hour prior to sexual activity as needed. 06/27/24   Jordan, Peter M, MD  Study - ARTEMIS - ziltivekimab 15 mg/0.5 mL or placebo SQ injection (PI-Christopher) Inject 0.5 mLs (15 mg total) into the skin every 28 (twenty-eight) days. Investigational Study Drug. Please contact the [facility] team for any questions or concerns regarding  this medication. 06/08/24   Lonni Slain, MD  ticagrelor  (BRILINTA ) 90 MG TABS tablet Take 1 tablet (90 mg total) by mouth 2 (two) times daily. 12/18/23   Jordan, Peter M, MD    Family History Family History  Problem Relation Age of Onset   Cancer Father     Social History Social History   Tobacco Use   Smoking status: Former    Current packs/day: 0.00    Average packs/day: 0.5 packs/day for 40.0 years (20.0 ttl pk-yrs)    Types: Cigarettes    Start date: 50    Quit date: 2024    Years since  quitting: 1.8   Smokeless tobacco: Never   Tobacco comments:    09/09/2023 patient stopped smoking  Vaping Use   Vaping status: Never Used  Substance Use Topics   Alcohol use: No   Drug use: No     Allergies   Patient has no known allergies.   Review of Systems Review of Systems  HENT:  Positive for sore throat.   Skin:        Nipple irritation     Physical Exam Triage Vital Signs ED Triage Vitals  Encounter Vitals Group     BP 07/28/24 1931 117/78     Girls Systolic BP Percentile --      Girls Diastolic BP Percentile --      Boys Systolic BP Percentile --      Boys Diastolic BP Percentile --      Pulse Rate 07/28/24 1931 77     Resp 07/28/24 1931 16     Temp 07/28/24 1931 98.1 F (36.7 C)     Temp Source 07/28/24 1931 Oral     SpO2 07/28/24 1931 96 %     Weight --      Height --      Head Circumference --      Peak Flow --      Pain Score 07/28/24 1929 8     Pain Loc --      Pain Education --      Exclude from Growth Chart --    No data found.  Updated Vital Signs BP 117/78 (BP Location: Left Arm)   Pulse 77   Temp 98.1 F (36.7 C) (Oral)   Resp 16   SpO2 96%   Visual Acuity Right Eye Distance:   Left Eye Distance:   Bilateral Distance:    Right Eye Near:   Left Eye Near:    Bilateral Near:     Physical Exam Vitals and nursing note reviewed.  Constitutional:      General: He is not in acute distress.    Appearance: Normal appearance. He is not ill-appearing.  HENT:     Head: Normocephalic and atraumatic.     Mouth/Throat:     Mouth: Mucous membranes are moist.     Pharynx: Posterior oropharyngeal erythema present.  Eyes:     Pupils: Pupils are equal, round, and reactive to light.  Cardiovascular:     Rate and Rhythm: Normal rate.  Pulmonary:     Effort: Pulmonary effort is normal.  Chest:     Comments: The central aspect of the right nipple is slightly swollen compared to the left.  There is no induration or fluctuance or erythema  around the nipple.  Area is tender to palpation.  No drainage or inversion. Skin:    General: Skin is warm and dry.  Neurological:     General: No  focal deficit present.     Mental Status: He is alert and oriented to person, place, and time.  Psychiatric:        Mood and Affect: Mood normal.        Behavior: Behavior normal.      UC Treatments / Results  Labs (all labs ordered are listed, but only abnormal results are displayed) Labs Reviewed  POCT RAPID STREP A (OFFICE) - Normal  CULTURE, GROUP A STREP [facility])    EKG   Radiology No results found.  Procedures Procedures (including critical care time)  Medications Ordered in UC Medications - No data to display  Initial Impression / Assessment and Plan / UC Course  I have reviewed the triage vital signs and the nursing notes.  Pertinent labs & imaging results that were available during my care of the patient were reviewed by me and considered in my medical decision making (see chart for details).     I reviewed exam and symptoms with patient.  No red flags.  Negative rapid strep, will send strep throat culture.  He declined COVID testing.  Discussed viral illness and symptomatic treatment.  Unclear if his nipple symptoms are a side effect of his cholesterol medication, advise he follow-up with his cardiologist regarding this and not to stop it unless instructed by his cardiologist.  Will do trial of mupirocin and dressing applied by nursing staff for comfort.  ER precautions reviewed and patient verbalized understanding. Final Clinical Impressions(s) / UC Diagnoses   Final diagnoses:  Sore throat  Viral pharyngitis  Nipple inflammation     Discharge Instructions      Start mupirocin topically twice daily to the right nipple and use a dressing as a barrier.  Follow-up with your cardiologist regarding your concern that this may be related to your cholesterol medication.  Your strep throat swab was negative.  You may  take Tylenol  over-the-counter as needed.  He may also do salt water gargles and warm liquids such as teas and honey.  Lots of rest and fluids.  Please follow-up with your PCP if your symptoms do not improve.  Please go to the ER for any worsening symptoms.  I hope you feel better soon!    ED Prescriptions     Medication Sig Dispense Auth. Provider   mupirocin ointment (BACTROBAN) 2 % Apply 1 Application topically 2 (two) times daily for 5 days. 22 g Roniya Tetro, Jodi R, NP      PDMP not reviewed this encounter.   Loreda Myla SAUNDERS, NP 07/28/24 1950

## 2024-07-28 NOTE — ED Triage Notes (Signed)
 Pt adds that for a week had hard, painful right nipple. Reports that read could be side effect of Crestor  that patient takes. They did message patient's cardiologist today about it but hasn't heard back.

## 2024-07-28 NOTE — ED Triage Notes (Signed)
Pt c/o sore throat that started yesterday.  

## 2024-07-28 NOTE — Discharge Instructions (Addendum)
 Start mupirocin topically twice daily to the right nipple and use a dressing as a barrier.  Follow-up with your cardiologist regarding your concern that this may be related to your cholesterol medication.  Your strep throat swab was negative.  You may take Tylenol  over-the-counter as needed.  He may also do salt water gargles and warm liquids such as teas and honey.  Lots of rest and fluids.  Please follow-up with your PCP if your symptoms do not improve.  Please go to the ER for any worsening symptoms.  I hope you feel better soon!

## 2024-07-31 LAB — CULTURE, GROUP A STREP (THRC)

## 2024-08-01 ENCOUNTER — Ambulatory Visit (HOSPITAL_COMMUNITY): Payer: Self-pay

## 2024-08-25 ENCOUNTER — Other Ambulatory Visit (HOSPITAL_BASED_OUTPATIENT_CLINIC_OR_DEPARTMENT_OTHER): Payer: Self-pay

## 2024-08-25 ENCOUNTER — Other Ambulatory Visit: Payer: Self-pay

## 2024-09-07 ENCOUNTER — Encounter

## 2024-09-08 DIAGNOSIS — Z006 Encounter for examination for normal comparison and control in clinical research program: Secondary | ICD-10-CM

## 2024-09-08 NOTE — Research (Signed)
 LVM for pt to call research office back to reschedule [facility] V[facility], and reminded pt not to use pen that expired 09/Dec/2025.

## 2024-09-19 ENCOUNTER — Encounter: Payer: Self-pay | Admitting: Cardiology

## 2024-09-23 ENCOUNTER — Other Ambulatory Visit (HOSPITAL_BASED_OUTPATIENT_CLINIC_OR_DEPARTMENT_OTHER): Payer: Self-pay

## 2024-09-27 DIAGNOSIS — Z006 Encounter for examination for normal comparison and control in clinical research program: Secondary | ICD-10-CM

## 2024-09-30 NOTE — Research (Signed)
 Reached out to pt to r/s [facility] visit and change out pens. Pt did not answer, left message to call research office back and reminded pt to not dose with expired pen in possession.

## 2024-10-28 DIAGNOSIS — Z006 Encounter for examination for normal comparison and control in clinical research program: Secondary | ICD-10-CM

## 2024-10-28 NOTE — Research (Cosign Needed)
 LVM for patient to call research office back to discuss staying in [facility] trial. Called pt wife, unable to LVM.
